# Patient Record
Sex: Female | Born: 1995 | Race: White | Hispanic: No | Smoking: Never smoker
Health system: Southern US, Community
[De-identification: ages and names within clinical notes are randomized; demographics above are authoritative.]

## PROBLEM LIST (undated history)

## (undated) ENCOUNTER — Ambulatory Visit: Admission: EM | Payer: Medicaid Other | Source: Home / Self Care

## (undated) DIAGNOSIS — Z789 Other specified health status: Secondary | ICD-10-CM

## (undated) DIAGNOSIS — M1990 Unspecified osteoarthritis, unspecified site: Secondary | ICD-10-CM

## (undated) DIAGNOSIS — F329 Major depressive disorder, single episode, unspecified: Secondary | ICD-10-CM

## (undated) DIAGNOSIS — F32A Depression, unspecified: Secondary | ICD-10-CM

## (undated) DIAGNOSIS — F419 Anxiety disorder, unspecified: Secondary | ICD-10-CM

## (undated) HISTORY — PX: TYMPANOSTOMY TUBE PLACEMENT: SHX32

## (undated) HISTORY — DX: Unspecified osteoarthritis, unspecified site: M19.90

---
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
## 2015-03-24 ENCOUNTER — Encounter (HOSPITAL_COMMUNITY): Payer: Self-pay | Admitting: Emergency Medicine

## 2015-03-24 ENCOUNTER — Emergency Department (HOSPITAL_COMMUNITY)
Admission: EM | Admit: 2015-03-24 | Discharge: 2015-03-24 | Disposition: A | Discharge status: Other Acute Inpatient Hospital | Payer: BLUE CROSS/BLUE SHIELD | Attending: Emergency Medicine | Admitting: Emergency Medicine

## 2015-03-24 ENCOUNTER — Inpatient Hospital Stay (HOSPITAL_COMMUNITY)
Admission: AD | Admit: 2015-03-24 | Discharge: 2015-03-28 | DRG: 885 | Disposition: A | Discharge status: Home / Self Care | Payer: BLUE CROSS/BLUE SHIELD | Source: Intra-hospital | Attending: Psychiatry | Admitting: Psychiatry

## 2015-03-24 DIAGNOSIS — R45851 Suicidal ideations: Secondary | ICD-10-CM | POA: Insufficient documentation

## 2015-03-24 DIAGNOSIS — F332 Major depressive disorder, recurrent severe without psychotic features: Secondary | ICD-10-CM | POA: Diagnosis present

## 2015-03-24 DIAGNOSIS — Z3202 Encounter for pregnancy test, result negative: Secondary | ICD-10-CM | POA: Diagnosis not present

## 2015-03-24 HISTORY — DX: Other specified health status: Z78.9

## 2015-03-24 LAB — CBC
HCT: 39.8 % (ref 36.0–46.0)
Hemoglobin: 13.5 g/dL (ref 12.0–15.0)
MCH: 28.3 pg (ref 26.0–34.0)
MCHC: 33.9 g/dL (ref 30.0–36.0)
MCV: 83.4 fL (ref 78.0–100.0)
Platelets: 346 10*3/uL (ref 150–400)
RBC: 4.77 MIL/uL (ref 3.87–5.11)
RDW: 12.5 % (ref 11.5–15.5)
WBC: 8.7 10*3/uL (ref 4.0–10.5)

## 2015-03-24 LAB — COMPREHENSIVE METABOLIC PANEL
ALT: 15 U/L (ref 0–35)
AST: 18 U/L (ref 0–37)
Albumin: 4.3 g/dL (ref 3.5–5.2)
Alkaline Phosphatase: 44 U/L (ref 39–117)
Anion gap: 10 (ref 5–15)
BUN: 12 mg/dL (ref 6–23)
CO2: 22 mmol/L (ref 19–32)
Calcium: 9.6 mg/dL (ref 8.4–10.5)
Chloride: 105 mmol/L (ref 96–112)
Creatinine, Ser: 0.7 mg/dL (ref 0.50–1.10)
GFR calc Af Amer: >90 mL/min (ref >90)
GFR calc non Af Amer: >90 mL/min (ref >90)
Glucose, Bld: 100 mg/dL — ABNORMAL HIGH (ref 70–99)
Potassium: 3.7 mmol/L (ref 3.5–5.1)
Sodium: 137 mmol/L (ref 135–145)
Total Bilirubin: 0.6 mg/dL (ref 0.3–1.2)
Total Protein: 7.4 g/dL (ref 6.0–8.3)

## 2015-03-24 LAB — ACETAMINOPHEN LEVEL: Acetaminophen (Tylenol), Serum: <10 ug/mL — ABNORMAL LOW (ref 10–30)

## 2015-03-24 LAB — ETHANOL: Alcohol, Ethyl (B): <5 mg/dL (ref 0–9)

## 2015-03-24 LAB — RAPID URINE DRUG SCREEN, HOSP PERFORMED
Amphetamines: NOT DETECTED
Barbiturates: NOT DETECTED
Benzodiazepines: NOT DETECTED
Cocaine: NOT DETECTED
Opiates: NOT DETECTED
Tetrahydrocannabinol: NOT DETECTED

## 2015-03-24 LAB — SALICYLATE LEVEL: Salicylate Lvl: <4 mg/dL (ref 2.8–20.0)

## 2015-03-24 LAB — PREGNANCY, URINE: Preg Test, Ur: NEGATIVE

## 2015-03-24 MED ORDER — LORAZEPAM 1 MG PO TABS: 1.0000 mg | ORAL_TABLET | Freq: Three times a day (TID) | ORAL | Status: DC | PRN

## 2015-03-24 NOTE — ED Notes (Signed)
MD at bedside. 

## 2015-03-24 NOTE — ED Notes (Signed)
Pt. Noted sitting in room. No complaints or concerns voiced. No distress or abnormal behavior noted. Will continue to monitor with security cameras. Q 15 minute rounds continue. 

## 2015-03-24 NOTE — ED Notes (Addendum)
Pt's belongings are going home with her parents. Pt wanded by security.

## 2015-03-24 NOTE — BH Assessment (Addendum)
Tele Assessment Note   [name] is an 19 y.o. female, single, Caucasian who presents to [facility] ED accompanied by her parents, who did not participate in assessment. Pt reports she has experienced symptoms of depression since 8th grade but over the past four months her symptoms have more acute and frequent. She states today she was so depressed she got her parent's shotgun with the intention of shooting herself. She says she realized she needed help and came to the ED. Pt reports she intended to kill herself one other time and was going to jump from a parking deck but a friend intervened. Pt reports symptoms including daily crying spells, decreased sleep, social withdrawal, anhedonia, irritability and feelings of hopelessness. She reports poor appetite and losing 25 pounds since she started college. She scaled her current depression at 9/10. She says she has a history of superficial cutting or her arms, thighs and abdomen. She says she has experienced some mild anxiety and denies any history of panic. She denies homicidal ideation or history of violence. She denies any history of psychotic symptoms. She denies alcohol or substance use.  Pt cannot identify any precipitant for her suicidal gesture today. She also cannot identify any specific stressors. She states she finds college no more stressful than high school. She denies any relationship, academic, legal, medical or financial problems. She denies any history of abuse or trauma. She lives on-campus at [facility] when she is not staying with her parents. She describes her parents as supportive but does not feel they realize the severity of her depression. She has no siblings. There is no known history of mental health or substance abuse problems. Pt states she has a friend at college who knows everything about her depression and another friend from high school who has experienced depression herself and is also supportive.   Pt states she started  seeing a counselor through [facility] three weeks ago and has an appointment with a physician for medication evaluation 03/29/15. She says she has never taken psychiatric medication before. She has no other history of outpatient or inpatient mental health or substance abuse treatment.  Pt is dressed in hospital scrubs, alert, oriented x4 with normal speech and normal motor behavior. Eye contact is good. Pt's mood is depressed and affect is congruent with mood. Thought process is coherent and relevant. There is no indication Pt is currently responding to internal stimuli or experiencing delusional thought content. Pt was calm and cooperative throughout assessment. She says she expected to be admitted to an inpatient psychiatric facility and wants hep for her depression.   Axis I: Major Depressive Disorder, Recurrent, Severe Without Psychotic Features. Axis II: Deferred Axis III: No past medical history on file. Axis IV: other psychosocial or environmental problems Axis V: GAF=30  Past Medical History: No past medical history on file.  Past Surgical History  Procedure Laterality Date  . Tympanostomy tube placement      Family History: No family history on file.  Social History:  reports that she has never smoked. She does not have any smokeless tobacco history on file. She reports that she does not drink alcohol or use illicit drugs.  Additional Social History:  Alcohol / Drug Use Pain Medications: None Prescriptions: None Over the Counter: None History of alcohol / drug use?: No history of alcohol / drug abuse Longest period of sobriety (when/how long): NA  CIWA: CIWA-Ar BP: 152/74 mmHg Pulse Rate: 84 COWS:    PATIENT  STRENGTHS: (choose at least two) Ability for insight Average or above average intelligence Capable of independent living Communication skills Financial means General fund of knowledge Motivation for treatment/growth Physical Health Supportive  family/friends  Allergies: No Known Allergies  Home Medications:  (Not in a hospital admission)  OB/GYN Status:  Patient's last menstrual period was 02/28/2015.  General Assessment Data Location of Assessment: [facility] ED Is this a Tele or Face-to-Face Assessment?: Face-to-Face Is this an Initial Assessment or a Re-assessment for this encounter?: Initial Assessment Living Arrangements: Parent, Other (Comment) (Lives on campus at [facility] when not with parents) Can pt return to current living arrangement?: Yes Admission Status: Voluntary Is patient capable of signing voluntary admission?: Yes Transfer from: Home Referral Source: Self/Family/Friend     [facility] Crisis Care Plan Living Arrangements: Parent, Other (Comment) (Lives on campus at [facility] when not with parents) Name of Psychiatrist: None Name of Therapist: Zenda Alpers at [facility]  Education Status Is patient currently in school?: Yes Current Grade: Freshman Highest grade of school patient has completed: High School Name of school: [employer]  Contact person: NA  Risk to self with the past 6 months Suicidal Ideation: Yes-Currently Present Suicidal Intent: Yes-Currently Present Is patient at risk for suicide?: Yes Suicidal Plan?: Yes-Currently Present Specify Current Suicidal Plan: Had shotgun earlier today and was going to shoot herself Access to Means: Yes Specify Access to Suicidal Means: Parent's shotgun What has been your use of drugs/alcohol within the last 12 months?: Pt denies Previous Attempts/Gestures: Yes How many times?: 1 (February 17, 2015 was going to jump off a parking deck) Other Self Harm Risks: Pt reports history of superficial cutting Triggers for Past Attempts: None known Intentional Self Injurious Behavior: Cutting Comment - Self Injurious Behavior: Pt reports superficial cutting on arms, thighs, abdomen Family Suicide History: No Recent stressful life event(s): Other (Comment) (Pt cannot identify any  specific stressors) Persecutory voices/beliefs?: No Depression: Yes Depression Symptoms: Despondent, Insomnia, Tearfulness, Isolating, Fatigue, Guilt, Loss of interest in usual pleasures, Feeling angry/irritable, Feeling worthless/self pity Substance abuse history and/or treatment for substance abuse?: No Suicide prevention information given to non-admitted patients: Not applicable  Risk to Others within the past 6 months Homicidal Ideation: No Thoughts of Harm to Others: No Current Homicidal Intent: No Current Homicidal Plan: No Access to Homicidal Means: No Identified Victim: None History of harm to others?: No Assessment of Violence: None Noted Violent Behavior Description: Pt denies history of violence Does patient have access to weapons?: Yes (Comment) (Pt has access to shotgun) Criminal Charges Pending?: No Does patient have a court date: No  Psychosis Hallucinations: None noted Delusions: None noted  Mental Status Report Appearance/Hygiene: In scrubs Eye Contact: Good Motor Activity: Unremarkable Speech: Logical/coherent Level of Consciousness: Alert Mood: Depressed Affect: Depressed Anxiety Level: None Thought Processes: Coherent, Relevant Judgement: Unimpaired Orientation: Person, Place, Time, Situation, Appropriate for developmental age Obsessive Compulsive Thoughts/Behaviors: None  Cognitive Functioning Concentration: Normal Memory: Recent Intact, Remote Intact IQ: Average Insight: Fair Impulse Control: Fair Appetite: Poor Weight Loss: 25 Weight Gain: 0 Sleep: Decreased Total Hours of Sleep: 5 Vegetative Symptoms: None  ADLScreening [facility]) Patient's cognitive ability adequate to safely complete daily activities?: Yes Patient able to express need for assistance with ADLs?: Yes Independently performs ADLs?: Yes (appropriate for developmental age)  Prior Inpatient Therapy Prior Inpatient Therapy: No Prior Therapy Dates: NA Prior  Therapy Facilty/Provider(s): NA Reason for Treatment: NA  Prior Outpatient Therapy Prior Outpatient Therapy: Yes Prior Therapy Dates: Current  Prior Therapy Facilty/Provider(s): [facility] Reason for Treatment: Depression  ADL Screening (condition at time of admission) Patient's cognitive ability adequate to safely complete daily activities?: Yes Is the patient deaf or have difficulty hearing?: No Does the patient have difficulty seeing, even when wearing glasses/contacts?: No Does the patient have difficulty concentrating, remembering, or making decisions?: No Patient able to express need for assistance with ADLs?: Yes Does the patient have difficulty dressing or bathing?: No Independently performs ADLs?: Yes (appropriate for developmental age) Does the patient have difficulty walking or climbing stairs?: No Weakness of Legs: None Weakness of Arms/Hands: None  Home Assistive Devices/Equipment Home Assistive Devices/Equipment: None    Abuse/Neglect Assessment (Assessment to be complete while patient is alone) Physical Abuse: Denies Verbal Abuse: Denies Sexual Abuse: Denies Exploitation of patient/patient's resources: Denies Self-Neglect: Denies     Merchant navy officerAdvance Directives (For Healthcare) Does patient have an advance directive?: No Would patient like information on creating an advanced directive?: No - patient declined information    Additional Information 1:1 In Past 12 Months?: No CIRT Risk: No Elopement Risk: No Does patient have medical clearance?: Yes     Disposition: Binnie RailJoann Glover[facility] confirmed bed [facility]. Gave clinical report to Hulan FessIjeoma Nwaeze, NP who agreed Pt meets criteria for inpatient psychiatric treatment and accepted Pt to the service of Dr. Carmon GinsbergF. Cobos, room 402-2. Notified Dr. Ethelda ChickJacubowitz and [facility] of acceptance. Pt signed voluntary consent for treatment.   Disposition Disposition of Patient: Inpatient treatment  program Type of inpatient treatment program: Adult   Pamalee LeydenFord Ellis Warrick Jr, [facility], [facility] Triage Specialist [phone]   Pamalee LeydenWarrick Jr, Ford Ellis 03/24/2015 9:28 PM

## 2015-03-24 NOTE — BH Assessment (Signed)
Spoke to Dr. Doug SouSam Jacubowitz who said Pt reports depressive symptoms and states she is suicidal and had a shotgun earlier today. Assessment will be initiated.  .fw

## 2015-03-24 NOTE — ED Notes (Signed)
Pt. To [facility] from ED ambulatory without difficulty, to room 37. Pt. Is alert and oriented, warm and dry in no distress. Pt. Denies SI, HI, and AVH. Pt. Calm and cooperative. Pt. Made aware of security cameras and Q15 minute rounds. Pt. Encouraged to let Nursing staff know of any concerns or needs.  

## 2015-03-24 NOTE — ED Notes (Signed)
[facility] Expected date:  Expected time:  Means of arrival:  [facility]

## 2015-03-24 NOTE — ED Provider Notes (Signed)
CSN: [id number]     Arrival date & time 03/24/15  2014 History   First MD Initiated Contact with Patient 03/24/15 2024     Chief Complaint  Patient presents with  . Suicidal     (Consider location/radiation/quality/duration/timing/severity/associated sxs/prior Treatment) HPI Patient reports feeling suicidal. She reports that tonight she had a shotgun and thought about killing herself. She does not feel it safe at home. She is not depressed over any specific reason however has suffered depression since the eighth grade. No treatment prior to coming here. No other associated symptoms. No past medical history on file. Past Surgical History  Procedure Laterality Date  . Tympanostomy tube placement     No family history on file. History  Substance Use Topics  . Smoking status: Never Smoker   . Smokeless tobacco: Not on file  . Alcohol Use: No   OB History    No data available     Review of Systems  Constitutional: Negative.   HENT: Negative.   Respiratory: Negative.   Cardiovascular: Negative.   Gastrointestinal: Negative.   Musculoskeletal: Negative.   Skin: Negative.   Neurological: Negative.   Psychiatric/Behavioral: Positive for suicidal ideas.  All other systems reviewed and are negative.     Allergies  Review of patient's allergies indicates no known allergies.  Home Medications   Prior to Admission medications   Not on File   BP 152/74 mmHg  Pulse 84  Temp(Src) 98.9 F (37.2 C) (Oral)  SpO2 100%  LMP 02/28/2015 Physical Exam  Constitutional: She is oriented to person, place, and time. She appears well-developed and well-nourished.  HENT:  Head: Normocephalic and atraumatic.  Eyes: Conjunctivae are normal. Pupils are equal, round, and reactive to light.  Neck: Neck supple. No tracheal deviation present. No thyromegaly present.  Cardiovascular: Normal rate and regular rhythm.   No murmur heard. Pulmonary/Chest: Effort normal and breath sounds normal.   Abdominal: Soft. Bowel sounds are normal. She exhibits no distension. There is no tenderness.  Musculoskeletal: Normal range of motion. She exhibits no edema or tenderness.  Neurological: She is alert and oriented to person, place, and time. No cranial nerve deficit. Coordination normal.  Gait normal  Skin: Skin is warm and dry. No rash noted.  Psychiatric: She has a normal mood and affect.  Nursing note and vitals reviewed.   ED Course  Procedures (including critical care time) Labs Review Labs Reviewed - No data to display  Imaging Review No results found.   EKG Interpretation None     10 PM patient resting comfortably alert Glasgow Coma Score 15, sitting in bed eating a sandwich Results for orders placed or performed during the hospital encounter of 03/24/15  Acetaminophen level  Result Value Ref Range   Acetaminophen (Tylenol), Serum <10.0 (L) 10 - 30 ug/mL  CBC  Result Value Ref Range   WBC 8.7 4.0 - 10.5 K/uL   RBC 4.77 3.87 - 5.11 MIL/uL   Hemoglobin 13.5 12.0 - 15.0 g/dL   HCT 40.939.8 81.136.0 - 91.446.0 %   MCV 83.4 78.0 - 100.0 fL   MCH 28.3 26.0 - 34.0 pg   MCHC 33.9 30.0 - 36.0 g/dL   RDW 78.212.5 95.611.5 - 21.315.5 %   Platelets 346 150 - 400 K/uL  Comprehensive metabolic panel  Result Value Ref Range   Sodium 137 135 - 145 mmol/L   Potassium 3.7 3.5 - 5.1 mmol/L   Chloride 105 96 - 112 mmol/L   CO2 22 19 -  32 mmol/L   Glucose, Bld 100 (H) 70 - 99 mg/dL   BUN 12 6 - 23 mg/dL   Creatinine, Ser 9.14 0.50 - 1.10 mg/dL   Calcium 9.6 8.4 - 78.2 mg/dL   Total Protein 7.4 6.0 - 8.3 g/dL   Albumin 4.3 3.5 - 5.2 g/dL   AST 18 0 - 37 U/L   ALT 15 0 - 35 U/L   Alkaline Phosphatase 44 39 - 117 U/L   Total Bilirubin 0.6 0.3 - 1.2 mg/dL   GFR calc non Af Amer >90 >90 mL/min   GFR calc Af Amer >90 >90 mL/min   Anion gap 10 5 - 15  Ethanol (ETOH)  Result Value Ref Range   Alcohol, Ethyl (B) <5 0 - 9 mg/dL  Salicylate level  Result Value Ref Range   Salicylate Lvl <4.0 2.8 - 20.0  mg/dL   No results found.  MDM  I feel the patient warrants inpatient stay for psychiatric evaluation as she is a serious suicide risk and has access to a gun Final diagnoses:  None   Inpatient bed has been arranged for patient at [facility] Diagnosis suicidal ideation    Doug Sou, MD 03/24/15 2201

## 2015-03-24 NOTE — ED Notes (Signed)
Pt states that she has been feeling depressed for 2-3 months and [facility] but states that she doesn't feel like its helping. States she has been having thoughts of shooting herself. Alert and oriented. Accompanied by parents but she asked them to step to waiting room.

## 2015-03-25 ENCOUNTER — Encounter (HOSPITAL_COMMUNITY): Payer: Self-pay

## 2015-03-25 DIAGNOSIS — F332 Major depressive disorder, recurrent severe without psychotic features: Principal | ICD-10-CM

## 2015-03-25 DIAGNOSIS — R45851 Suicidal ideations: Secondary | ICD-10-CM

## 2015-03-25 LAB — TSH: TSH: 4.1 u[IU]/mL (ref 0.350–4.500)

## 2015-03-25 MED ORDER — MAGNESIUM HYDROXIDE 400 MG/5ML PO SUSP: 30.0000 mL | Freq: Every day | ORAL | Status: DC | PRN

## 2015-03-25 MED ORDER — CITALOPRAM HYDROBROMIDE 10 MG PO TABS
10.0000 mg | ORAL_TABLET | Freq: Every day | ORAL | Status: DC
  Administered 2015-03-25 – 2015-03-28 (×4): 10 mg via ORAL
  Filled 2015-03-25 (×2): qty 1
  Filled 2015-03-25: qty 7
  Filled 2015-03-25 (×3): qty 1

## 2015-03-25 MED ORDER — ACETAMINOPHEN 325 MG PO TABS: 650.0000 mg | ORAL_TABLET | Freq: Four times a day (QID) | ORAL | Status: DC | PRN

## 2015-03-25 MED ORDER — ALUM & MAG HYDROXIDE-SIMETH 200-200-20 MG/5ML PO SUSP: 30.0000 mL | ORAL | Status: DC | PRN

## 2015-03-25 MED ORDER — TRAZODONE HCL 50 MG PO TABS
50.0000 mg | ORAL_TABLET | Freq: Every evening | ORAL | Status: DC | PRN
  Filled 2015-03-25: qty 7

## 2015-03-25 MED ORDER — NAPROXEN 500 MG PO TABS
500.0000 mg | ORAL_TABLET | Freq: Two times a day (BID) | ORAL | Status: DC
  Administered 2015-03-25 – 2015-03-26 (×2): 500 mg via ORAL
  Filled 2015-03-25 (×11): qty 1

## 2015-03-25 NOTE — Progress Notes (Signed)
D:Patient in the dayroom on approach.  When patient was asked how her day was she stated, "fine."  Patient states she has depression and has had it for sometime but cannot describe it.  Patient is unable to identify any stressors and has a hard time verbalizing how she feels at this time.  Patient states, "it's hard to explain."  Patient denies SI/HI and denies AVH.  Patient verbally contracts for safety.   A: Staff to monitor Q 15 mins for safety.  Encouragement and support offered.  Scheduled medications administered per orders. R: Patient remains safe on the unit.  Patient attended group tonight.  Patient visible on the unit.  Patient taking administered medications.

## 2015-03-25 NOTE — Progress Notes (Signed)
Patient admitted voluntarily for suicidal ideations that she reports having for the past 3 or 4 months. She reports that her depression has worsened and she needed to come in for help. She has had thoughts to shoot self. She has no medical history. 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 She currently denies si/hi/a/v hallucinations. She wants to be started on depression medication. She was oriented to unit and safety is maintained on unit with 15 min checks.

## 2015-03-25 NOTE — Tx Team (Signed)
Initial Interdisciplinary Treatment Plan   PATIENT STRESSORS: Educational concerns   PATIENT STRENGTHS: Ability for insight Motivation for treatment/growth   PROBLEM LIST: Problem List/Patient Goals Date to be addressed Date deferred Reason deferred Estimated date of resolution  Depression      SI                  Patients goal- Patient reports not to feel as bad as she has lately and wants to be back to point where she does not feeling suicidal.                               DISCHARGE CRITERIA:  Improved stabilization in mood, thinking, and/or behavior  PRELIMINARY DISCHARGE PLAN: Return to previous living arrangement  PATIENT/FAMIILY INVOLVEMENT: This treatment plan has been presented to and reviewed with the patient, [name], and/or family member.  The patient and family have been given the opportunity to ask questions and make suggestions.  Floyce StakesGarrison, Jacqueline 03/25/2015, 2:08 AM
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
## 2015-03-25 NOTE — Progress Notes (Signed)
D: Patient denies SI/HI and A/V hallucinations; patient reports sleep is poor; reports appetite is poor; reports energy level is normal; rates depression as 3/10; rates hopelessness 2/10; rates anxiety as 4/10;   A: Monitored q 15 minutes; patient encouraged to attend groups; patient educated about medications; patient given medications per physician orders; patient encouraged to express feelings and/or concerns  R: Patient is flat and depressed; patient is blunted; patient is cooperative; patient is guarded and mostly gives one worded answers; patient's interaction with staff and peers is minimal; patient was able to set goal to talk with staff 1:1 when having feelings of SI; patient is taking medications as prescribed and tolerating medications; patient is attending all groups

## 2015-03-25 NOTE — Progress Notes (Signed)
Recreation Therapy Notes  Date: 03.28.2016 Time: 9:30am Location: [address] Group Room   Group Topic: Stress Management  Goal Area(s) Addresses:  Patient will actively participate in stress management techniques presented during session.   Behavioral Response: Did not attend.   Marykay Lexenise L Blanchfield, LRT/CTRS  Blanchfield, Denise L 03/25/2015 10:17 AM

## 2015-03-25 NOTE — Progress Notes (Signed)
[facility] Group Notes:  (Nursing/MHT/Case Management/Adjunct)  Date:  03/25/2015  Time:  11:56 PM  Type of Therapy:  Group Therapy  Participation Level:  Minimal  Participation Quality:  Appropriate  Affect:  Appropriate  Cognitive:  Appropriate  Insight:  Appropriate  Engagement in Group:  Engaged  Modes of Intervention:  Socialization and Support  Summary of Progress/Problems: Pt. Was engaged and had good insight in group discussion.  Pt. Stated that she is more social and laughs more when she is feeling well.  Sondra ComeWilson, Ryan J 03/25/2015, 11:56 PM

## 2015-03-25 NOTE — B[facility] Counselor (Signed)
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 Employment / Job issues: N/A Family Relationships: Reports that parents "freak out" about her depression which is stressful Surveyor, quantityinancial / Lack of resources (include bankruptcy): Financial stressors but receives assistance from parents Housing / Lack of housing: Lives on campus since August 2015, has 1 roommate gets along well with Physical health (include injuries & life threatening diseases): N/A Social relationships:  Reports one stressful friendship Substance abuse: N/A Bereavement / Loss: Denies  Living/Environment/Situation:  Living Arrangements: Lives on campus with a suitemate Living conditions (as described by patient or guardian): Lives on campus with a suitemate How long has patient lived in current situation?: Since August 2015 What is atmosphere in current home: Comfortable, Supportive  Family History:  Marital status: Single  Additional relationship information: N/A Does patient have children?: No  Childhood History:  By whom was/is the patient raised?: Both parents Description of patient's relationship with caregiver when they were a child: "got kind of bad when I was in middle school- my parents almost got divorced so they fought a lot". Felt like she was the "Museum/gallery curatorpeace keeper" Patient's description of current relationship with people who raised him/her: Worries about upsetting parents so does not share a lot with them about her depression  Does patient have siblings?: No Did patient suffer any verbal/emotional/physical/sexual abuse as a child?: No Did patient suffer from severe childhood neglect?: No Has patient ever been sexually abused/assaulted/raped as an adolescent or adult?: No Was the patient ever a victim of a crime or a disaster?: No Witnessed domestic violence?: No Has  patient been effected by domestic violence as an adult?: No   Education:  Highest grade of school patient has completed: Printmakerreshman in college Currently a student?: Yes- [place] Learning disability?: No   Employment/Work Situation:  Employment situation: Consulting civil engineertudent What is the longest time patient has a held a job?: 6 months Where was the patient employed at that time?: [employer]  Has patient ever been in the Eli Lilly and Companymilitary?: No Has patient ever served in Buyer, retailcombat?: No  Financial Resources:  Surveyor, quantityinancial resources: Therapist, sportstudent loans and family support Does patient have a Lawyerrepresentative payee or guardian?: No  Alcohol/Substance Abuse:  What has been your use of drugs/alcohol within the last 12 months?: Denies If attempted suicide, did drugs/alcohol play a role in this?: No Alcohol/Substance Abuse Treatment Hx: Denies past history Has alcohol/substance abuse ever caused legal problems?: No  Social Support System:  Conservation officer, natureatient's Community Support System: Strong Describe Community Support System: Family, friends, counselor on campus Type of faith/religion: Patient does not identify as religious How does patient's faith help to cope with current illness?: N/A  Leisure/Recreation:  Leisure and Hobbies: soccer, drawing   Strengths/Needs:  What things does the patient do well?: drawing, athletic In what areas does patient struggle / problems for patient: depression and isolation, loss of appetite, losing weigh quickly  Discharge Plan:  Does patient have access to transportation?: Yes Will patient be returning to same living situation after discharge?: Yes- plans to return home Currently receiving community mental health services: Yes (From Whom) ([facility] CatholicDara O'Donnell) If no, would patient like referral for services when discharged?: No Does patient have financial barriers related to discharge medications?: No Patient description of barriers related to  discharge medications: N/A  Summary/Recommendations:  Patient is a 19 year old Caucasian female admitted for SI and increasing depression.  Patient is a Printmaker at [employer] She reports having a strong support system and follows up with [facility] and reports that she will be seeing a psychiatrist on Friday. Patient will benefit from crisis stabilization, medication evaluation, group therapy, and psycho education in addition to case management for discharge planning. Patient and CSW reviewed pt's identified goals and treatment plan. Pt verbalized understanding and agreed to treatment plan.  Samuella Bruin, MSW, [employer] Clinical Social Worker [facility] [phone]

## 2015-03-26 LAB — HEMOGLOBIN A1C
Hgb A1c MFr Bld: 5.3 % (ref 4.8–5.6)
Mean Plasma Glucose: 105 mg/dL

## 2015-03-26 NOTE — Progress Notes (Signed)
[facility] Group Notes:  (Nursing/MHT/Case Management/Adjunct)  Date:  03/26/2015  Time:  11:54 PM  Type of Therapy:  Group Therapy  Participation Level:  Active  Participation Quality:  Appropriate  Affect:  Appropriate  Cognitive:  Appropriate  Insight:  Appropriate  Engagement in Group:  Engaged  Modes of Intervention:  Socialization and Support  Summary of Progress/Problems: Pt. Was engaged in group discussion of recovery.  Pt. Stated her recovery is when she is more social, feels happy, and laughs.  Sondra ComeWilson, Ryan J 03/26/2015, 11:54 PM

## 2015-03-26 NOTE — Progress Notes (Signed)
Pt attended spiritual care group on grief and loss facilitated by chaplain Burnis KingfisherMatthew Stalnaker and counseling intern [place]. Group opened with brief discussion and psycho-social ed around grief and loss in relationships and in relation to self - identifying life patterns, circumstances, changes that cause losses. Established group norm of speaking from own life experience. Group goal of establishing open and affirming space for members to share loss and experience with grief, normalize grief experience and provide psycho social education and grief support.  Group drew on narrative and Alderian therapeutic modalities.   [name] was present throughout group but did not contribute to group discussion. Did appear to be attentive as evidence by nodding and eye contact with other group members.  [place] Austin Counseling Intern

## 2015-03-26 NOTE — Tx Team (Signed)
Interdisciplinary Treatment Plan Update   Date Reviewed:  03/26/2015  Time Reviewed:  8:47 AM  Progress in Treatment:   Attending groups: Yes, patient is attending groups. Participating in groups: Yes, engages in group discussion. Taking medication as prescribed: Yes  Tolerating medication: Yes Family/Significant other contact made:  No, but will ask patient for consent for collateral contact Patient understands diagnosis: Yes, patient understands diagnosis and need for treatment. Discussing patient identified problems/goals with staff: Yes, patient is able to express goals for treatment and discharge. Medical problems stabilized or resolved: Yes Denies suicidal/homicidal ideation: Yes Patient has not harmed self or others: Yes  For review of initial/current patient goals, please see plan of care.  Estimated Length of Stay:  2-4 days  Reasons for Continued Hospitalization:  Anxiety Depression Medication stabilization   New Problems/Goals identified:    Discharge Plan or Barriers:   Home with outpatient follow up to be determined  Additional Comments:  Continue medication stabilization  Patient and CSW reviewed patient's identified goals and treatment plan.  Patient verbalized understanding and agreed to treatment plan.   Attendees:  Patient:  03/26/2015 8:47 AM   Signature:  Sallyanne HaversF. Cobos, MD 03/26/2015 8:47 AM  Signature: Geoffery LyonsIrving Lugo, MD 03/26/2015 8:47 AM  Signature:  Earl ManySara Twyman, RN 03/26/2015 8:47 AM  Signature:  Liborio NixonPatrice White, RN 03/26/2015 8:47 AM  Signature:   03/26/2015 8:47 AM  Signature:  Juline PatchQuylle Hodnett, LCSW 03/26/2015 8:47 AM  Signature:  Belenda CruiseKristin Drinkard, LCSW-A 03/26/2015 8:47 AM  Signature:  Leisa LenzValerie Enoch, Care Coordinator [facility] 03/26/2015 8:47 AM  Signature:   03/26/2015 8:47 AM  Signature:  03/26/2015  8:47 AM  Signature:   Onnie BoerJennifer Clark, RN [facility] 03/26/2015  8:47 AM  Signature:   03/26/2015  8:47 AM    Scribe for Treatment Team:   Juline PatchQuylle Hodnett,  03/26/2015 8:47  AM

## 2015-03-26 NOTE — [facility] Suicide Risk Assessment (Signed)
[facility] INPATIENT:  Family/Significant Other Suicide Prevention Education  Suicide Prevention Education:  Education Completed; Michele JohnSonia Baird -  Mother, [phone]; has been identified by the patient as the family member/significant other with whom the patient will be residing, and identified as the person(s) who will aid the patient in the event of a mental health crisis (suicidal ideations/suicide attempt).  With written consent from the patient, the family member/significant other has been provided the following suicide prevention education, prior to the and/or following the discharge of the patient.  The suicide prevention education provided includes the following:  Suicide risk factors  Suicide prevention and interventions  National Suicide Hotline telephone number  [facility] assessment telephone number  [facility] Emergency Assistance 911  [facility] and/or [facility]idential [facility] Crisis [facility] telephone number  Request made of family/significant other to:  Remove weapons (e.g., guns, rifles, knives), all items previously/currently identified as safety concern.   Mother advised patient does not have access to weapons.     Remove drugs/medications (over-the-counter, prescriptions, illicit drugs), all items previously/currently identified as a safety concern.  The family member/significant other verbalizes understanding of the suicide prevention education information provided.  The family member/significant other agrees to remove the items of safety concern listed above.  [name], [name] 03/26/2015, 12:39 PM

## 2015-03-26 NOTE — Plan of Care (Signed)
Problem: Diagnosis: Increased Risk For Suicide Attempt Goal: LTG-Patient Will Show Positive Response to Medication LTG (by discharge) : Patient will show positive response to medication and will participate in the development of the discharge plan.  Outcome: Not Progressing Patient still rates depression high and voices she cannot explain the way she is feeling currently.

## 2015-03-26 NOTE — Clinical Social Work Note (Signed)
CSW receive [facility] advising they are crisis intervention and recommending [facility].  Mr. [name] [phone] advised they refer patient's out when there has been a hospitalization.

## 2015-03-26 NOTE — Progress Notes (Signed)
[facility] MD Progress Note  03/26/2015 4:46 PM [name]  MRN:  [id number] Subjective:   States she is feeling " a little better". She is feeling less  Severely depressed. Denies any suicidal ideations at this time and states " I have not been thinking about it ".  Objective : I have discussed case with treatment team and have met with patient. Has been visible in milieu, going to groups, although staff reports that she has been somewhat withdrawn, with limited participation. States she has started to socialize with peers. Family came to visit last night, states  Visit went well. Thus far denies any medication side effects from Celexa. Describes ongoing neuro-vegetative symptoms such as poor appetite, fair energy level, some anhedonia.  TSH WNL,  HgbA1C WNL.   Principal Problem: Major depressive disorder, recurrent severe without psychotic features Diagnosis:   Patient Active Problem List   Diagnosis Date Noted  . Major depressive disorder, recurrent severe without psychotic features [F33.2] 03/25/2015   Total Time spent with patient: 25 minutes    Past Medical History:  Past Medical History  Diagnosis Date  . Medical history non-contributory     Past Surgical History  Procedure Laterality Date  . Tympanostomy tube placement     Family History: History reviewed. No pertinent family history. Social History:  History  Alcohol Use No     History  Drug Use No    History   Social History  . Marital Status: Single    Spouse Name: N/A  . Number of Children: N/A  . Years of Education: N/A   Social History Main Topics  . Smoking status: Never Smoker   . Smokeless tobacco: Not on file  . Alcohol Use: No  . Drug Use: No  . Sexual Activity: No   Other Topics Concern  . None   Social History Narrative   Additional History:    Sleep: Fair  Appetite:  Fair   Assessment:   Musculoskeletal: Strength & Muscle Tone: within normal limits Gait & Station:  normal Patient leans: N/A   Psychiatric Specialty Exam: Physical Exam  Review of Systems  Constitutional: Negative for fever and chills.  Respiratory: Negative for cough and shortness of breath.   Cardiovascular: Negative for chest pain.  Gastrointestinal: Positive for nausea. Negative for vomiting, abdominal pain and diarrhea.  Skin: Negative for rash.  Neurological: Positive for headaches.  Psychiatric/Behavioral: Positive for depression.    Blood pressure 124/49, pulse 91, temperature 98.2 F (36.8 C), temperature source Oral, resp. rate 16, height _0  (1.6 m), weight 139 lb (63.05 kg), last menstrual period 02/28/2015.Body mass index is 24.63 kg/(m^2).  General Appearance: Fairly Groomed  Engineer, water::  Good  Speech:  Normal Rate  Volume:  Normal  Mood:  states she is feeling better, less depressed   Affect:  Constricted  Thought Process:  Goal Directed and Linear  Orientation:  Full (Time, Place, and Person)  Thought Content:  Denies hallucinations, no delusions, not internally preoccupied   Suicidal Thoughts:  No At this time denies any suicidal plan or intent and contracts for safety on the unit  Homicidal Thoughts:  No  Memory:  recent and remote grossly intact   Judgement:  Fair  Insight:  Fair  Psychomotor Activity:  Normal  Concentration:  Good  Recall:  Good  Fund of Knowledge:Good  Language: Good  Akathisia:  Negative  Handed:  Right  AIMS (if indicated):     Assets:  Desire for Improvement Resilience  ADL's:  Improving   Cognition: WNL  Sleep:  Number of Hours: 5.75     Current Medications: Current Facility-Administered Medications  Medication Dose Route Frequency Provider Last Rate Last Dose  . acetaminophen (TYLENOL) tablet 650 mg  650 mg Oral Q6H PRN [name], NP      . alum & mag hydroxide-simeth (MAALOX/MYLANTA) 200-200-20 MG/5ML suspension 30 mL  30 mL Oral Q4H PRN [name], NP      . citalopram (CELEXA) tablet 10 mg  10 mg Oral  Daily [name], MD   10 mg at 03/26/15 0836  . magnesium hydroxide (MILK OF MAGNESIA) suspension 30 mL  30 mL Oral Daily PRN [name], NP      . naproxen (NAPROSYN) tablet 500 mg  500 mg Oral BID WC [name], PA-C   500 mg at 03/26/15 0835  . traZODone (DESYREL) tablet 50 mg  50 mg Oral QHS PRN [name], NP        Lab Results:  Results for orders placed or performed during the hospital encounter of 03/24/15 (from the past 48 hour(s))  Hemoglobin A1c     Status: None   Collection Time: 03/25/15  6:42 AM  Result Value Ref Range   Hgb A1c MFr Bld 5.3 4.8 - 5.6 %    Comment: (NOTE)         Pre-diabetes: 5.7 - 6.4         Diabetes: >6.4         Glycemic control for adults with diabetes: <7.0    Mean Plasma Glucose 105 mg/dL    Comment: (NOTE) Performed At: [facility] [place], [place] [id number] Lindon Romp MD [id number] Performed at [facility]   TSH     Status: None   Collection Time: 03/25/15  6:42 AM  Result Value Ref Range   TSH 4.100 0.350 - 4.500 uIU/mL    Comment: Performed at [facility]    Physical Findings: AIMS: Facial and Oral Movements Muscles of Facial Expression: None, normal Lips and Perioral Area: None, normal Jaw: None, normal Tongue: None, normal,Extremity Movements Upper (arms, wrists, hands, fingers): None, normal Lower (legs, knees, ankles, toes): None, normal, Trunk Movements Neck, shoulders, hips: None, normal, Overall Severity Severity of abnormal movements (highest score from questions above): None, normal Incapacitation due to abnormal movements: None, normal Patient's awareness of abnormal movements (rate only patient's report): No Awareness, Dental Status Current problems with teeth and/or dentures?: No Does patient usually wear dentures?: No  CIWA:    COWS:      Assessment- at this time patient reports some improvement. States she is less depressed  and no longer having suicidal ideations. Denies any side effects on Celexa thus far .   Treatment Plan Summary: Daily contact with patient to assess and evaluate symptoms and progress in treatment, Medication management, Plan continue inpatient admission and Continue medications as below Celexa 10 mgrs QAM   Medical Decision Making:  Established Problem, Stable/Improving (1), Review of Psycho-Social Stressors (1), Review or order clinical lab tests (1) and Review of Medication Regimen & Side Effects (2)     COBOS, FERNANDO 03/26/2015, 4:46 PM

## 2015-03-26 NOTE — [facility] Group Notes (Signed)
[facility] LCSW Group Therapy          Overcoming Obstacles       1:15 -2:30        03/26/2015       Type of Therapy:  Group Therapy  Participation Level:  Appropriate  Participation Quality:  Appropriate  Affect: Depressed, Flat  Cognitive:  Appropriate  Insight: Developing/Improving Engaged  Engagement in Therapy: Developing/Imprvoing Engaged  Modes of Intervention:  Discussion Exploration  Education Rapport BuildingProblem-Solving Support  Summary of Progress/Problems:  The main focus of today's group was overcoming obstacles.  Patient shared she feels labeled.  She talked about how friends tend to treat her differently and often to not invite her to functions they are attending. Patient able to identify appropriate coping skills including asking friends to understand she is the same person as before they found out her diagnosis.   [name], Quylle Hairston 03/26/2015

## 2015-03-26 NOTE — [facility] Group Notes (Signed)
[facility] Group Notes:  (Nursing/MHT/Case Management/Adjunct)  Date:  03/26/2015  Time:  0930  Type of Therapy:  Nurse Education  Participation Level:  Active  Participation Quality:  Appropriate  Affect:  Appropriate  Cognitive:  Appropriate  Insight:  Appropriate  Engagement in Group:  Engaged  Modes of Intervention:  Discussion and Education  Summary of Progress/Problems:  [name], Patrice L 03/26/2015, 10:17 AM

## 2015-03-26 NOTE — Progress Notes (Signed)
Patient ID: [name], female   DOB: 01/10/1996, 19 y.o.   MRN: [id number] D: Client is visible on the unit, interacting with peers and staff appropriately. Client reports day has been "good" denies SHI. Client goal: "keep doing things and be around people" A: Writer provided emotional support, encouraged client to continue to interact and reports any concerns to staff. Staff will monitor q3715min for safety. R: client is safe on the unit, attended group.

## 2015-03-26 NOTE — Progress Notes (Signed)
Patient ID: [name], female   DOB: 12/25/1996, 19 y.o.   MRN: [id number]   Pt currently presents with a flat affect and depressed behavior. Per self inventory, pt rates depression at a 7, hopelessness 2 and anxiety 6. Pt's daily goal is to "I woke up feeling pretty anxious and bad, so just coming back from that" and they intend to do so by "just keep doing things." Pt reports fair sleep, poor concentration and a poor appetite. Pt also reports low energy. Pt reports "menstrual cramp pains" throughout the day today.   Pt provided with medications per providers orders. Pt's labs and vitals were monitored throughout the day. Pt supported emotionally and encouraged to express concerns and questions. Pt educated on medications.  Pt's safety ensured with 15 minute and environmental checks. Pt currently denies SI/HI and A/V hallucinations. Pt verbally agrees to seek staff if SI/HI or A/VH occurs and to consult with staff before acting on these thoughts. Pt smiles and asks writer "Can I have a friend visit me, if she has my code?" Pt encouraged to reach out to positive support systems.

## 2015-03-27 NOTE — Plan of Care (Signed)
Problem: Alteration in mood Goal: LTG-Pt's behavior demonstrates decreased signs of depression 3/28: Goal met: Patient's behavior demonstrates decreased signs of depression to the point the patient is safe to return home and continue treatment in an outpatient setting. Patient rates depression at 3 today.  Tilden Fossa, MSW, [place] Clinical Social Worker [facility] [phone] Outcome: Completed/Met Date Met:  03/27/15 Pt in the milieu throughout the day and actively participates in groups. Pt seen laughing and smiling in the dayroom.

## 2015-03-27 NOTE — [facility] Group Notes (Signed)
[facility] LCSW Group Therapy  Emotional Regulation 1:15 - 2: 30 PM        03/27/2015     Type of Therapy:  Group Therapy  Participation Level:  Appropriate  Participation Quality:  Appropriate  Affect:  Appropriate  Cognitive:  Attentive Appropriate  Insight:  Developing/Improving Engaged  Engagement in Therapy:  Developing/Improving Engaged  Modes of Intervention:  Discussion Exploration Problem-Solving Supportive  Summary of Progress/Problems:  Group topic was emotional regulations.  Patient participated in the discussion and was able to identify an emotion that needed to regulated as frustration.  She shared she has to learn the source of her feelings - where it is coming from.  Patient shared with patient whose son committed suicide her thanks for the openness she shared in group.  Patient's stated never again will she attempt suicide as she now understands what her mother could have been experiencing.  Patient stated she would never want her mother to go through the pain and guilt other patient is experiencing.   Patient was able to identify approprite coping skills.  [name], [name] 03/27/2015

## 2015-03-27 NOTE — Progress Notes (Signed)
Recreation Therapy Notes  Date: 03.30.2016 Time: 9:30am Location: [address] Group Room   Group Topic: Stress Management  Goal Area(s) Addresses:  Patient will actively participate in stress management techniques presented during session.   Behavioral Response: Did not attend.   Denise L Blanchfield, LRT/CTRS  Blanchfield, Denise L 03/27/2015 3:10 PM 

## 2015-03-27 NOTE — Progress Notes (Signed)
Patient ID: [name], female   DOB: June 27, 1996, 19 y.o.   MRN: [id number] [facility] MD Progress Note  03/27/2015 4:50 PM [name]  MRN:  [id number] Subjective:   Patient states she is feeling better and that at this time she is not feeling depressed at all. Denies any lingering suicidal ideations and  Is future oriented, wanting to go back to college in [place] soon after discharge. She states she is happy because she found out her outpatient therapist in [place], with who  She has a good rapport, is able to continue seeing her after discharge.  Objective : I have discussed case with treatment team and have met with patient. Patient feeling better and presenting euthymic, with a full range of affect. States " this is the best I have felt in months". Denies neuro-vegetative symptoms of depression at this time and denies anhedonia, sadness or low self esteem. Denies any SI, and has not exhibited any self injurious behaviors on unit. She is tolerating Celexa well, denies side effects. With her express consent and in her presence I spoke with her mother on phone and reviewed patient's current improvement/presentation ( mother has visited and agrees patient seems improved ) , current medication regimen, and possible discharge plans. Mother wondered if patient could have increased depression/dysphoria /SI on week prior to Menses- I asked patient about this and she does not think there is any correlation. She does not endorse premenstrual dysphoria at this time Patient is visible on unit and going to groups.   Principal Problem: Major depressive disorder, recurrent severe without psychotic features Diagnosis:   Patient Active Problem List   Diagnosis Date Noted  . Major depressive disorder, recurrent severe without psychotic features [F33.2] 03/25/2015   Total Time spent with patient: 25 minutes    Past Medical History:  Past Medical History  Diagnosis Date  . Medical history  non-contributory     Past Surgical History  Procedure Laterality Date  . Tympanostomy tube placement     Family History: History reviewed. No pertinent family history. Social History:  History  Alcohol Use No     History  Drug Use No    History   Social History  . Marital Status: Single    Spouse Name: N/A  . Number of Children: N/A  . Years of Education: N/A   Social History Main Topics  . Smoking status: Never Smoker   . Smokeless tobacco: Not on file  . Alcohol Use: No  . Drug Use: No  . Sexual Activity: No   Other Topics Concern  . None   Social History Narrative   Additional History:    Sleep: improved   Appetite:  Improved    Assessment:   Musculoskeletal: Strength & Muscle Tone: within normal limits Gait & Station: normal Patient leans: N/A   Psychiatric Specialty Exam: Physical Exam  Review of Systems  Constitutional: Negative for fever and chills.  Respiratory: Negative for cough and shortness of breath.   Cardiovascular: Negative for chest pain.  Gastrointestinal: Negative for vomiting and abdominal pain.  Genitourinary: Negative for dysuria, urgency and frequency.  Musculoskeletal: Negative for myalgias and back pain.  Skin: Negative for rash.  Neurological: Negative for seizures.  Psychiatric/Behavioral: Positive for depression.    Blood pressure 119/69, pulse 73, temperature 98.2 F (36.8 C), temperature source Oral, resp. rate 20, height 5' 3"  (1.6 m), weight 139 lb (63.05 kg), last menstrual period 02/28/2015.Body mass index is 24.63 kg/(m^2).  General Appearance: Well  Groomed  Engineer, water::  Good  Speech:  Normal Rate  Volume:  Normal  Mood:  describes normal mood and appears euthymic   Affect:  Appropriate and Full Range  Thought Process:  Goal Directed and Linear  Orientation:  Full (Time, Place, and Person)  Thought Content:  Denies hallucinations, no delusions, not internally preoccupied   Suicidal Thoughts:  No At this time  denies any suicidal plan or intent and contracts for safety on the unit  Homicidal Thoughts:  No  Memory:  recent and remote grossly intact   Judgement:  Fair  Insight:  Fair  Psychomotor Activity:  Normal  Concentration:  Good  Recall:  Good  Fund of Knowledge:Good  Language: Good  Akathisia:  Negative  Handed:  Right  AIMS (if indicated):     Assets:  Desire for Improvement Resilience  ADL's:  Improving   Cognition: WNL  Sleep:  Number of Hours: 5.75     Current Medications: Current Facility-Administered Medications  Medication Dose Route Frequency Provider Last Rate Last Dose  . acetaminophen (TYLENOL) tablet 650 mg  650 mg Oral Q6H PRN [name], NP      . alum & mag hydroxide-simeth (MAALOX/MYLANTA) 200-200-20 MG/5ML suspension 30 mL  30 mL Oral Q4H PRN [name], NP      . citalopram (CELEXA) tablet 10 mg  10 mg Oral Daily [name], MD   10 mg at 03/27/15 0807  . magnesium hydroxide (MILK OF MAGNESIA) suspension 30 mL  30 mL Oral Daily PRN [name], NP      . naproxen (NAPROSYN) tablet 500 mg  500 mg Oral BID WC [name], PA-C   500 mg at 03/26/15 0835  . traZODone (DESYREL) tablet 50 mg  50 mg Oral QHS PRN [name], NP        Lab Results:  No results found for this or any previous visit (from the past 99 hour(s)).  Physical Findings: AIMS: Facial and Oral Movements Muscles of Facial Expression: None, normal Lips and Perioral Area: None, normal Jaw: None, normal Tongue: None, normal,Extremity Movements Upper (arms, wrists, hands, fingers): None, normal Lower (legs, knees, ankles, toes): None, normal, Trunk Movements Neck, shoulders, hips: None, normal, Overall Severity Severity of abnormal movements (highest score from questions above): None, normal Incapacitation due to abnormal movements: None, normal Patient's awareness of abnormal movements (rate only patient's report): No Awareness, Dental Status Current problems with  teeth and/or dentures?: No Does patient usually wear dentures?: No  CIWA:    COWS:      Assessment- at this time patient  Reports feeling much better and denies any ongoing depression. Mood seems euthymic and does not endorse neurovegetative symptoms of depression or SI. Tolerating Celexa well thus far.  Treatment Plan Summary: Daily contact with patient to assess and evaluate symptoms and progress in treatment, Medication management, Plan continue inpatient admission and Continue medications as belowContinue Celexa 10 mgrs QAM   Medical Decision Making:  Established Problem, Stable/Improving (1), Review of Psycho-Social Stressors (1), Review or order clinical lab tests (1) and Review of Medication Regimen & Side Effects (2)     COBOS, FERNANDO 03/27/2015, 4:50 PM

## 2015-03-27 NOTE — Progress Notes (Signed)
Pt currently presents with a flat affect and depressed behavior. Per self inventory, pt rates depression, hopelessness and anxiety at a 0. Pt's daily goal is "staying as upbeat and vibrant as I was by mid-day yesterday" and they intend to do so by "keep putting myself around others and doing things." Pt reports fair sleep, good concentration and a poor appetite.   Pt provided with medications per providers orders. Pt's labs and vitals were monitored throughout the day. Pt supported emotionally and encouraged to express concerns and questions. Pt educated on medications.  Pt's safety ensured with 15 minute and environmental checks. Pt currently denies SI/HI and A/V hallucinations. Pt verbally agrees to seek staff if SI/HI or A/VH occurs and to consult with staff before acting on these thoughts. Pt present in milieu and active in groups today.

## 2015-03-28 MED ORDER — CITALOPRAM HYDROBROMIDE 10 MG PO TABS: 10.0000 mg | ORAL_TABLET | Freq: Every day | ORAL | Status: DC

## 2015-03-28 MED ORDER — TRAZODONE HCL 50 MG PO TABS: 50.0000 mg | ORAL_TABLET | Freq: Every evening | ORAL | Status: DC | PRN

## 2015-03-28 NOTE — Progress Notes (Signed)
Discharge instructions: Pt received both written and verbal discharge instructions. Pt verbalized understanding of discharge instructions. Pt agreed to f/u appt and med regimen. Pt received sample meds and prescriptions. Pt received belongings from room and locker.  Pt denies suicidal thoughts at time of discharge. Pt safely left [facility] with her mother.

## 2015-03-28 NOTE — Discharge Summary (Signed)
Physician Discharge Summary Note  Patient:  [name] is an 19 y.o., female MRN:  [id number] DOB:  1996-05-11 Patient phone:  [phone] (home)  Patient address:   [address] [place] [address],  Total Time spent with patient: 30 minutes  Date of Admission:  03/24/2015 Date of Discharge: 03/28/2015  Reason for Admission:  depression  Principal Problem: Major depressive disorder, recurrent severe without psychotic features Discharge Diagnoses: Patient Active Problem List   Diagnosis Date Noted  . Major depressive disorder, recurrent severe without psychotic features [F33.2] 03/25/2015    Musculoskeletal: Strength & Muscle Tone: within normal limits Gait & Station: normal Patient leans: N/A  Psychiatric Specialty Exam:  SEE SRA Physical Exam  Vitals reviewed.   Review of Systems  Constitutional: Negative for fever and chills.  Cardiovascular: Negative for chest pain.  All other systems reviewed and are negative.   Blood pressure 119/69, pulse 73, temperature 98.2 F (36.8 C), temperature source Oral, resp. rate 20, height  (1.6 m), weight 63.05 kg (139 lb), last menstrual period 02/28/2015.Body mass index is 24.63 kg/(m^2).   Past Medical History:  Past Medical History  Diagnosis Date  . Medical history non-contributory     Past Surgical History  Procedure Laterality Date  . Tympanostomy tube placement     Family History: History reviewed. No pertinent family history. Social History:  History  Alcohol Use No     History  Drug Use No    History   Social History  . Marital Status: Single    Spouse Name: N/A  . Number of Children: N/A  . Years of Education: N/A   Social History Main Topics  . Smoking status: Never Smoker   . Smokeless tobacco: Not on file  . Alcohol Use: No  . Drug Use: No  . Sexual Activity: No   Other Topics Concern  . None   Social History Narrative   Risk to Self: Is patient at risk for suicide?:  Yes Risk to Others:   Prior Inpatient Therapy:   Prior Outpatient Therapy:    Level of Care:  OP  Hospital Course:  LAURIE PENADO was admitted for Major depressive disorder, recurrent severe without psychotic features and crisis management.  She was treated discharged with the medications listed below under Medication List.  Medical problems were identified and treated as needed.  Home medications were restarted as appropriate.  Improvement was monitored by observation and Franky Macho daily report of symptom reduction.  Emotional and mental status was monitored by daily self-inventory reports completed by Franky Macho and clinical staff.         Franky Macho was evaluated by the treatment team for stability and plans for continued recovery upon discharge.  Franky Macho motivation was an integral factor for scheduling further treatment.  Employment, transportation, bed availability, health status, family support, and any pending legal issues were also considered during her hospital stay.  She was offered further treatment options upon discharge including but not limited to Residential, Intensive Outpatient, and Outpatient treatment.  Franky Macho will follow up with the services as listed below under Follow Up Information.     Upon completion of this admission the patient was both mentally and medically stable for discharge denying suicidal/homicidal ideation, auditory/visual/tactile hallucinations, delusional thoughts and paranoia.      Consults:  psychiatry  Significant Diagnostic Studies:  labs: per ED  Discharge Vitals:   Blood pressure 119/69, pulse 73, temperature  98.2 F (36.8 C), temperature source Oral, resp. rate 20, height 5\' 3"  (1.6 m), weight 63.05 kg (139 lb), last menstrual period 02/28/2015. Body mass index is 24.63 kg/(m^2). Lab Results:   No results found for this or any previous visit (from the past 72 hour(s)).  Physical Findings: AIMS: Facial and Oral  Movements Muscles of Facial Expression: None, normal Lips and Perioral Area: None, normal Jaw: None, normal Tongue: None, normal,Extremity Movements Upper (arms, wrists, hands, fingers): None, normal Lower (legs, knees, ankles, toes): None, normal, Trunk Movements Neck, shoulders, hips: None, normal, Overall Severity Severity of abnormal movements (highest score from questions above): None, normal Incapacitation due to abnormal movements: None, normal Patient's awareness of abnormal movements (rate only patient's report): No Awareness, Dental Status Current problems with teeth and/or dentures?: No Does patient usually wear dentures?: No  CIWA:    COWS:      See Psychiatric Specialty Exam and Suicide Risk Assessment completed by Attending Physician prior to discharge.  Discharge destination:  Home  Is patient on multiple antipsychotic therapies at discharge:  No   Has Patient had three or more failed trials of antipsychotic monotherapy by history:  No    Recommended Plan for Multiple Antipsychotic Therapies: NA     Medication List    STOP taking these medications        ibuprofen 600 MG tablet  Commonly known as:  ADVIL,MOTRIN      TAKE these medications      Indication   citalopram 10 MG tablet  Commonly known as:  CELEXA  Take 1 tablet (10 mg total) by mouth daily.   Indication:  Depression     traZODone 50 MG tablet  Commonly known as:  DESYREL  Take 1 tablet (50 mg total) by mouth at bedtime as needed for sleep.   Indication:  Trouble Sleeping           Follow-up Information    [facility]      [facility] On 03/29/2015.   Why:  You are scheduled with Dr. Peggyann Juba'Sullivan on Friday, March 29, 2015 at [facility]8AM   Contact information:   [address] [place], [place]   [id number]  [phone]      [facility] On 04/12/2015.   Why:  You are scheduled with Molli BarrowsJennie Smith on Friday, April 12, 2015 at [facility]10AM    Contact information:   [address] [place], [place]  [address]  [phone]      Follow-up recommendations:  Activity:  as tol, diet as tol  Comments:  1.  Take all your medications as prescribed.              2.  Report any adverse side effects to outpatient provider.                       3.  Patient instructed to not use alcohol or illegal drugs while on prescription medicines.            4.  In the event of worsening symptoms, instructed patient to call 911, the crisis hotline or go to nearest emergency room for evaluation of symptoms.  Total Discharge Time: 30 min  Signed: Velna HatchetSheila May Agustin AGNP-BC 03/28/2015, 2:45 PM   Patient seen, Suicide Assessment Completed.  Disposition Plan Reviewed

## 2015-03-28 NOTE — Progress Notes (Signed)
D: Pt presents flat in affect but brightens upon interaction. Pt's mood is appropriate. Pt verbalizes readiness to return back to school. Pt reports that she has gained appreciation of her own life from [facility] the stories [facility]. Pt is observed actively participating in the milieu. Pt is currently negative for any SI/HI/AVH. Pt reports having a good support system.  A: Writer administered scheduled and prn medications to pt. Continued support and availability as needed was extended to this pt. Staff continue to monitor pt with q7415min checks.  R: No adverse drug reactions noted. Pt receptive to treatment. Pt remains safe at this time.

## 2015-03-28 NOTE — [facility] Group Notes (Signed)
The focus of this group is to educate the patient on the purpose and policies of crisis stabilization and provide a format to answer questions about their admission.  The group details unit policies and expectations of patients while admitted. Patient attended this group and was engaging. 

## 2015-03-28 NOTE — [facility] Suicide Risk Assessment (Signed)
[facility]   Demographic Factors:  19 year old female, single, college student Dentist( studying Animal Science )   Total Time spent with patient: 30 minutes  Musculoskeletal: Strength & Muscle Tone: within normal limits Gait & Station: normal Patient leans: N/A  Psychiatric Specialty Exam: Physical Exam  ROS  Blood pressure 119/69, pulse 73, temperature 98.2 F (36.8 C), temperature source Oral, resp. rate 20, height 5\' 3"  (1.6 m), weight 139 lb (63.05 kg), last menstrual period 02/28/2015.Body mass index is 24.63 kg/(m^2).  General Appearance: improved grooming  Eye Contact::  Good  Speech:  Normal Rate  Volume:  Normal  Mood:  Euthymic  Affect:  Full Range  Thought Process:  Linear  Orientation:  Full (Time, Place, and Person)  Thought Content:  no hallucinations, no delusions  Suicidal Thoughts:  No- denies any thoughts of hurting self or anyone else   Homicidal Thoughts:  No  Memory:  Recent and remote grossly intact   Judgement:  Other:  improved   Insight:  improved  Psychomotor Activity:  Normal  Concentration:  Good  Recall:  Good  Fund of Knowledge:Good  Language: Good  Akathisia:  Negative  Handed:  Right  AIMS (if indicated):     Assets:  Communication Skills Desire for Improvement Physical Health Resilience  Sleep:  Number of Hours: 6.25  Cognition: WNL  ADL's:  Improved    Have you used any form of tobacco in the last 30 days? (Cigarettes, Smokeless Tobacco, Cigars, and/or Pipes): No  Has this patient used any form of tobacco in the last 30 days? (Cigarettes, Smokeless Tobacco, Cigars, and/or Pipes) No  Mental Status Per Nursing Assessment::   On Admission:  Suicidal ideation indicated by patient  Current Mental Status by Physician: At this time patient is improved compared to admission. Mood is described as normal and seems euthymic, affect is appropriate, full in range, no thought disorder, no SI , no HI, denies any self  injurious ideations, no hallucinations, no delusions, future oriented, looking forward to returning to college and seeing her friends   Loss Factors: Did not identify any clear stressors that resulted in increased depression  Historical Factors: History of Depression, history of suicidal ideations in the past, this was first psychiatric admission  Risk Reduction Factors:   Sense of responsibility to family, Positive social support, Positive therapeutic relationship and Positive coping skills or problem solving skills  Continued Clinical Symptoms:  At this time patient is much improved compared to admission, as above. Denies any current medication side effects.  Cognitive Features That Contribute To Risk:  No gross cognitive deficits noted upon discharge. Is alert , attentive, and oriented x 3   Suicide Risk:  Mild:  Suicidal ideation of limited frequency, intensity, duration, and specificity.  There are no identifiable plans, no associated intent, mild dysphoria and related symptoms, good self-control (both objective and subjective assessment), few other risk factors, and identifiable protective factors, including available and accessible social support.  Principal Problem: Major depressive disorder, recurrent severe without psychotic features Discharge Diagnoses:  Patient Active Problem List   Diagnosis Date Noted  . Major depressive disorder, recurrent severe without psychotic features [F33.2] 03/25/2015    Follow-up Information    [facility]      [facility] On 03/29/2015.   Why:  You are scheduled with Dr. Peggyann Juba'Sullivan on Friday, March 29, 2015 at [facility]8AM   Contact information:   [address] [place], [place]  [address]  [phone]      Follow up with [facility]- [facility] On 04/12/2015.   Why:  You are scheduled with Molli Barrows on Friday, April 12, 2015 at [facility] information:   [address] [place], [place]  [address]  [phone]       Plan Of Care/Follow-up recommendations:  Activity:  As tolerated Diet:  Regular Tests:  NA Other:  See below  Is patient on multiple antipsychotic therapies at discharge:  No   Has Patient had three or more failed trials of antipsychotic monotherapy by history:  No  Recommended Plan for Multiple Antipsychotic Therapies: NA   Patient is leaving unit in good spirits. Parents are picking her up. Plans to return to college soon. Follow up as above.     COBOS, FERNANDO 03/28/2015, 9:48 AM

## 2015-03-28 NOTE — Progress Notes (Signed)
  [facility] :  Will you be returning to the same living situation after discharge:  Yes,  Patient returning to school. At discharge, do you have transportation home?: Yes,  Parents transported patient home. Do you have the ability to pay for your medications: Yes,  Patient able to afford medications. or No.  Release of information consent forms completed and in the chart;  Patient's signature needed at discharge.  Patient to Follow up at: Follow-up Information    [facility]      [facility] On 03/29/2015.   Why:  You are scheduled with Dr. Peggyann Juba'Sullivan on Friday, March 29, 2015 at [facility]   Contact information:   [address] [place], [place]   [id number]  [phone]      Follow up with [facility] On 04/12/2015.   Why:  You are scheduled with Molli BarrowsJennie Smith on Friday, April 12, 2015 at [facility]10AM   Contact information:   [address] [place], [place]  [address]  [phone]      Patient denies SI/HI: Patient no longer endorsing SI/HI or other thoughts of self harm.     Safety Planning and Suicide Prevention discussed:  .Reviewed with all patients during discharge planning group   Have you used any form of tobacco in the last 30 days? (Cigarettes, Smokeless Tobacco, Cigars, and/or Pipes): No  Has patient been referred to the Quitline?: No referral needed to quitline.   [name], [name] 03/28/2015, 2:31 PM

## 2015-04-01 NOTE — Progress Notes (Signed)
Patient Discharge Instructions:  After Visit Summary (AVS):   Faxed to:  04/01/15 Discharge Summary Note:   Faxed to:  04/01/15 Psychiatric Admission Assessment Note:   Faxed to:  04/01/15 Suicide Risk Assessment - Discharge Assessment:   Faxed to:  04/01/15 Faxed/Sent to the Next Level Care provider:  04/01/15 Faxed to [facility] @ [phone] Faxed to [facility] @ [phone]  Jerelene ReddenSheena E Chambers, 04/01/2015, 2:25 PM

## 2018-12-01 ENCOUNTER — Emergency Department (HOSPITAL_COMMUNITY)
Admission: EM | Admit: 2018-12-01 | Discharge: 2018-12-01 | Disposition: A | Discharge status: Home / Self Care | Payer: BLUE CROSS/BLUE SHIELD | Attending: Emergency Medicine | Admitting: Emergency Medicine

## 2018-12-01 ENCOUNTER — Encounter (HOSPITAL_COMMUNITY): Payer: Self-pay | Admitting: *Deleted

## 2018-12-01 ENCOUNTER — Other Ambulatory Visit: Payer: Self-pay

## 2018-12-01 DIAGNOSIS — J208 Acute bronchitis due to other specified organisms: Secondary | ICD-10-CM

## 2018-12-01 DIAGNOSIS — R1084 Generalized abdominal pain: Secondary | ICD-10-CM

## 2018-12-01 LAB — URINALYSIS, MICROSCOPIC (REFLEX)

## 2018-12-01 LAB — CBC WITH DIFFERENTIAL/PLATELET
Abs Immature Granulocytes: 0.02 10*3/uL (ref 0.00–0.07)
Basophils Absolute: 0.1 10*3/uL (ref 0.0–0.1)
Basophils Relative: 1 %
Eosinophils Absolute: 0.1 10*3/uL (ref 0.0–0.5)
Eosinophils Relative: 1 %
HCT: 43.1 % (ref 36.0–46.0)
Hemoglobin: 13.9 g/dL (ref 12.0–15.0)
Immature Granulocytes: 0 %
Lymphocytes Relative: 20 %
Lymphs Abs: 1.8 10*3/uL (ref 0.7–4.0)
MCH: 26.9 pg (ref 26.0–34.0)
MCHC: 32.3 g/dL (ref 30.0–36.0)
MCV: 83.5 fL (ref 80.0–100.0)
Monocytes Absolute: 0.6 10*3/uL (ref 0.1–1.0)
Monocytes Relative: 7 %
Neutro Abs: 6.3 10*3/uL (ref 1.7–7.7)
Neutrophils Relative %: 71 %
Platelets: 368 10*3/uL (ref 150–400)
RBC: 5.16 MIL/uL — ABNORMAL HIGH (ref 3.87–5.11)
RDW: 12.1 % (ref 11.5–15.5)
WBC: 8.9 10*3/uL (ref 4.0–10.5)
nRBC: 0 % (ref 0.0–0.2)

## 2018-12-01 LAB — POC OCCULT BLOOD, ED: Fecal Occult Bld: NEGATIVE

## 2018-12-01 LAB — COMPREHENSIVE METABOLIC PANEL
ALT: 34 U/L (ref 0–44)
AST: 33 U/L (ref 15–41)
Albumin: 4.3 g/dL (ref 3.5–5.0)
Alkaline Phosphatase: 40 U/L (ref 38–126)
Anion gap: 16 — ABNORMAL HIGH (ref 5–15)
BUN: 9 mg/dL (ref 6–20)
CO2: 20 mmol/L — ABNORMAL LOW (ref 22–32)
Calcium: 9.5 mg/dL (ref 8.9–10.3)
Chloride: 106 mmol/L (ref 98–111)
Creatinine, Ser: 0.91 mg/dL (ref 0.44–1.00)
GFR calc Af Amer: >60 mL/min (ref >60)
GFR calc non Af Amer: >60 mL/min (ref >60)
Glucose, Bld: 107 mg/dL — ABNORMAL HIGH (ref 70–99)
Potassium: 3.1 mmol/L — ABNORMAL LOW (ref 3.5–5.1)
Sodium: 142 mmol/L (ref 135–145)
Total Bilirubin: 0.7 mg/dL (ref 0.3–1.2)
Total Protein: 7.8 g/dL (ref 6.5–8.1)

## 2018-12-01 LAB — URINALYSIS, ROUTINE W REFLEX MICROSCOPIC
Glucose, UA: NEGATIVE mg/dL
Ketones, ur: >80 mg/dL — AB
Leukocytes, UA: NEGATIVE
Nitrite: NEGATIVE
Protein, ur: NEGATIVE mg/dL
Specific Gravity, Urine: 1.025 (ref 1.005–1.030)
pH: 6 (ref 5.0–8.0)

## 2018-12-01 LAB — I-STAT BETA HCG BLOOD, ED (MC, WL, AP ONLY): I-stat hCG, quantitative: <5 m[IU]/mL (ref <5)

## 2018-12-01 LAB — LIPASE, BLOOD: Lipase: 25 U/L (ref 11–51)

## 2018-12-01 MED ORDER — IPRATROPIUM-ALBUTEROL 0.5-2.5 (3) MG/3ML IN SOLN
3.0000 mL | Freq: Once | RESPIRATORY_TRACT | Status: AC
  Administered 2018-12-01: 3 mL via RESPIRATORY_TRACT
  Filled 2018-12-01: qty 3

## 2018-12-01 MED ORDER — ALBUTEROL SULFATE HFA 108 (90 BASE) MCG/ACT IN AERS: 1.0000 | INHALATION_SPRAY | RESPIRATORY_TRACT | Status: DC | PRN

## 2018-12-01 MED ORDER — PANTOPRAZOLE SODIUM 20 MG PO TBEC: 20.0000 mg | DELAYED_RELEASE_TABLET | Freq: Every day | ORAL | 0 refills | Status: DC

## 2018-12-01 MED ORDER — DEXAMETHASONE 4 MG PO TABS: 8.0000 mg | ORAL_TABLET | Freq: Every day | ORAL | 0 refills | Status: DC

## 2018-12-01 MED ORDER — SODIUM CHLORIDE 0.9 % IV BOLUS
1000.0000 mL | Freq: Once | INTRAVENOUS | Status: AC
  Administered 2018-12-01: 1000 mL via INTRAVENOUS

## 2018-12-01 MED ORDER — SODIUM CHLORIDE 0.9 % IV SOLN
80.0000 mg | Freq: Once | INTRAVENOUS | Status: AC
  Administered 2018-12-01: 80 mg via INTRAVENOUS
  Filled 2018-12-01: qty 80

## 2018-12-01 NOTE — ED Notes (Signed)
Declined W/C at D/C and was escorted to lobby by RN. 

## 2018-12-01 NOTE — ED Provider Notes (Addendum)
[facility] Note   CSN: [id number] Arrival date & time: 12/01/18  1135     History   Chief Complaint Chief Complaint  Patient presents with  . Abdominal Pain    HPI [name] is a 22 y.o. female.  HPI   22 year old female with several complaints.  Occasional cough.  She feels like she has been wheezing.  She says she was seen in urgent care on Monday, diagnosed with the fluid started on Tamiflu.  Her symptoms have persisted.  She has also been having intermittent abdominal pain in the past several days.  Comes in waves.  Very sharp at times.  Worse shortly after eating.  This morning she had a dark black stool but also appeared to have some gross blood in it as well.  She reports a past history of ulcers and states that she was on medication for them previously, but none currently.  Denies significant NSAID usage.  Past Medical History:  Diagnosis Date  . Medical history non-contributory     Patient Active Problem List   Diagnosis Date Noted  . Major depressive disorder, recurrent severe without psychotic features (HCC) 03/25/2015    Past Surgical History:  Procedure Laterality Date  . TYMPANOSTOMY TUBE PLACEMENT       OB History   None      Home Medications    Prior to Admission medications   Medication Sig Start Date End Date Taking? Authorizing Provider  citalopram (CELEXA) 10 MG tablet Take 1 tablet (10 mg total) by mouth daily. 03/28/15   Adonis BrookAgustin, Sheila, NP  traZODone (DESYREL) 50 MG tablet Take 1 tablet (50 mg total) by mouth at bedtime as needed for sleep. 03/28/15   Adonis BrookAgustin, Sheila, NP    Family History History reviewed. No pertinent family history.  Social History Social History   Tobacco Use  . Smoking status: Never Smoker  . Smokeless tobacco: Never Used  Substance Use Topics  . Alcohol use: No  . Drug use: No     Allergies   Patient has no known allergies.   Review of  Systems Review of Systems  All systems reviewed and negative, other than as noted in HPI.  Physical Exam Updated Vital Signs BP 134/80 (BP Location: Right Arm)   Pulse (!) 112   Temp 98.5 F (36.9 C) (Oral)   Resp 14   Ht 5\' 5"  (1.651 m)   Wt 73.9 kg   LMP 12/01/2018   SpO2 100%   BMI 27.12 kg/m   Physical Exam  Constitutional: She appears well-developed and well-nourished. No distress.  HENT:  Head: Normocephalic and atraumatic.  Eyes: Conjunctivae are normal. Right eye exhibits no discharge. Left eye exhibits no discharge.  Neck: Neck supple.  Cardiovascular: Regular rhythm and normal heart sounds. Exam reveals no gallop and no friction rub.  No murmur heard. Mild tachycardia  Pulmonary/Chest: Effort normal. No respiratory distress. She has wheezes.  Faint and expiratory wheezing bilaterally  Abdominal: Soft. She exhibits no distension. There is tenderness.  Mild to moderate periumbilical and epigastric tenderness without rebound or guarding.  Musculoskeletal: She exhibits no edema or tenderness.  Neurological: She is alert.  Skin: Skin is warm and dry.  Psychiatric: She has a normal mood and affect. Her behavior is normal. Thought content normal.  Nursing note and vitals reviewed.    ED Treatments / Results  Labs (all labs ordered are listed, but only abnormal results are displayed) Labs Reviewed  CBC WITH DIFFERENTIAL/PLATELET - Abnormal; Notable for the following components:      Result Value   RBC 5.16 (*)    All other components within normal limits  COMPREHENSIVE METABOLIC PANEL - Abnormal; Notable for the following components:   Potassium 3.1 (*)    CO2 20 (*)    Glucose, Bld 107 (*)    Anion gap 16 (*)    All other components within normal limits  LIPASE, BLOOD  OCCULT BLOOD X 1 CARD TO LAB, STOOL  URINALYSIS, ROUTINE W REFLEX MICROSCOPIC  I-STAT BETA HCG BLOOD, ED ([facility], [facility], [facility] ONLY)  POC OCCULT BLOOD, ED    EKG None  Radiology No results  found.  Procedures Procedures (including critical care time)  Medications Ordered in ED Medications  ipratropium-albuterol (DUONEB) 0.5-2.5 (3) MG/3ML nebulizer solution 3 mL (has no administration in time range)  pantoprazole (PROTONIX) 80 mg in sodium chloride 0.9 % 100 mL IVPB (has no administration in time range)  sodium chloride 0.9 % bolus 1,000 mL (has no administration in time range)     Initial Impression / Assessment and Plan / ED Course  I have reviewed the triage vital signs and the nursing notes.  Pertinent labs & imaging results that were available during my care of the patient were reviewed by me and considered in my medical decision making (see chart for details).     22yf with several complaints.  Upper abdominal pain with reported black stool with blood.  Hemoglobin is normal at 13.9.  Hemoccult negative. BUN is normal as well which would argue against upper GI bleed.  Symptoms concerning enough to place on a PPI though.  Cough and shortness of breath.  She does have some mild expiratory wheezing on exam.  No accessory muscle usage.  o2 sats 100% on RA. Will place her on Decadron for acouple more days.  She says she already has an albuterol inhaler.  Likely viral bronchitis.   Minimal hypokalemia. Likely non-contributory and may be transient depending on timing of blood draw and getting albuterol in the ED.   UA noted. Abdominal pain more upper and no specific urinary complaints. Will send urine culture.   Final Clinical Impressions(s) / ED Diagnoses   Final diagnoses:  Viral bronchitis  Generalized abdominal pain    ED Discharge Orders    None           Raeford Razor, MD 12/01/18 1420

## 2018-12-01 NOTE — ED Triage Notes (Signed)
PT reports SHOB started on Sunday,ABD Pain started Today. Pt alsol reports bloody stools today . [facility]  And Dx with Flu and started on Tamaflu.

## 2018-12-02 LAB — URINE CULTURE: Culture: NO GROWTH

## 2019-10-18 ENCOUNTER — Other Ambulatory Visit: Payer: Self-pay

## 2019-10-18 DIAGNOSIS — Z20822 Contact with and (suspected) exposure to covid-19: Secondary | ICD-10-CM

## 2019-10-20 LAB — NOVEL CORONAVIRUS, NAA: SARS-CoV-2, NAA: NOT DETECTED

## 2020-01-22 ENCOUNTER — Ambulatory Visit: Payer: Self-pay | Attending: Internal Medicine

## 2020-01-22 DIAGNOSIS — Z20822 Contact with and (suspected) exposure to covid-19: Secondary | ICD-10-CM | POA: Insufficient documentation

## 2020-01-23 LAB — NOVEL CORONAVIRUS, NAA: SARS-CoV-2, NAA: NOT DETECTED

## 2020-04-07 ENCOUNTER — Encounter (HOSPITAL_COMMUNITY): Payer: Self-pay

## 2020-04-07 ENCOUNTER — Emergency Department (HOSPITAL_COMMUNITY)
Admission: EM | Admit: 2020-04-07 | Discharge: 2020-04-08 | Disposition: A | Discharge status: Other Acute Inpatient Hospital | Payer: Self-pay | Attending: Emergency Medicine | Admitting: Emergency Medicine

## 2020-04-07 ENCOUNTER — Other Ambulatory Visit: Payer: Self-pay

## 2020-04-07 DIAGNOSIS — F419 Anxiety disorder, unspecified: Secondary | ICD-10-CM | POA: Insufficient documentation

## 2020-04-07 DIAGNOSIS — R45851 Suicidal ideations: Secondary | ICD-10-CM | POA: Insufficient documentation

## 2020-04-07 DIAGNOSIS — Z79899 Other long term (current) drug therapy: Secondary | ICD-10-CM | POA: Insufficient documentation

## 2020-04-07 DIAGNOSIS — F1220 Cannabis dependence, uncomplicated: Secondary | ICD-10-CM | POA: Insufficient documentation

## 2020-04-07 DIAGNOSIS — F332 Major depressive disorder, recurrent severe without psychotic features: Secondary | ICD-10-CM | POA: Insufficient documentation

## 2020-04-07 DIAGNOSIS — Z20822 Contact with and (suspected) exposure to covid-19: Secondary | ICD-10-CM | POA: Insufficient documentation

## 2020-04-07 HISTORY — DX: Anxiety disorder, unspecified: F41.9

## 2020-04-07 HISTORY — DX: Depression, unspecified: F32.A

## 2020-04-07 LAB — CBC
HCT: 39.5 % (ref 36.0–46.0)
Hemoglobin: 13.6 g/dL (ref 12.0–15.0)
MCH: 29.1 pg (ref 26.0–34.0)
MCHC: 34.4 g/dL (ref 30.0–36.0)
MCV: 84.4 fL (ref 80.0–100.0)
Platelets: 376 10*3/uL (ref 150–400)
RBC: 4.68 MIL/uL (ref 3.87–5.11)
RDW: 11.7 % (ref 11.5–15.5)
WBC: 15 10*3/uL — ABNORMAL HIGH (ref 4.0–10.5)
nRBC: 0 % (ref 0.0–0.2)

## 2020-04-07 LAB — COMPREHENSIVE METABOLIC PANEL
ALT: 16 U/L (ref 0–44)
AST: 16 U/L (ref 15–41)
Albumin: 4.3 g/dL (ref 3.5–5.0)
Alkaline Phosphatase: 39 U/L (ref 38–126)
Anion gap: 10 (ref 5–15)
BUN: 13 mg/dL (ref 6–20)
CO2: 23 mmol/L (ref 22–32)
Calcium: 9.4 mg/dL (ref 8.9–10.3)
Chloride: 105 mmol/L (ref 98–111)
Creatinine, Ser: 0.76 mg/dL (ref 0.44–1.00)
GFR calc Af Amer: >60 mL/min (ref >60)
GFR calc non Af Amer: >60 mL/min (ref >60)
Glucose, Bld: 123 mg/dL — ABNORMAL HIGH (ref 70–99)
Potassium: 3.3 mmol/L — ABNORMAL LOW (ref 3.5–5.1)
Sodium: 138 mmol/L (ref 135–145)
Total Bilirubin: 1 mg/dL (ref 0.3–1.2)
Total Protein: 7.5 g/dL (ref 6.5–8.1)

## 2020-04-07 LAB — RAPID URINE DRUG SCREEN, HOSP PERFORMED
Amphetamines: NOT DETECTED
Barbiturates: NOT DETECTED
Benzodiazepines: NOT DETECTED
Cocaine: NOT DETECTED
Opiates: NOT DETECTED
Tetrahydrocannabinol: POSITIVE — AB

## 2020-04-07 LAB — ETHANOL: Alcohol, Ethyl (B): <10 mg/dL (ref <10)

## 2020-04-07 LAB — I-STAT BETA HCG BLOOD, ED (MC, WL, AP ONLY): I-stat hCG, quantitative: <5 m[IU]/mL (ref <5)

## 2020-04-07 LAB — ACETAMINOPHEN LEVEL: Acetaminophen (Tylenol), Serum: <10 ug/mL — ABNORMAL LOW (ref 10–30)

## 2020-04-07 LAB — SALICYLATE LEVEL: Salicylate Lvl: <7 mg/dL — ABNORMAL LOW (ref 7.0–30.0)

## 2020-04-07 MED ORDER — ZOLPIDEM TARTRATE 5 MG PO TABS
5.0000 mg | ORAL_TABLET | Freq: Every evening | ORAL | Status: DC | PRN
  Administered 2020-04-08: 5 mg via ORAL
  Filled 2020-04-07: qty 1

## 2020-04-07 MED ORDER — LORAZEPAM 0.5 MG PO TABS
0.5000 mg | ORAL_TABLET | Freq: Once | ORAL | Status: AC
  Administered 2020-04-07: 0.5 mg via ORAL
  Filled 2020-04-07: qty 1

## 2020-04-07 MED ORDER — TETANUS-DIPHTH-ACELL PERTUSSIS 5-2.5-18.5 LF-MCG/0.5 IM SUSP
0.5000 mL | Freq: Once | INTRAMUSCULAR | Status: DC
  Filled 2020-04-07: qty 0.5

## 2020-04-07 MED ORDER — POTASSIUM CHLORIDE CRYS ER 20 MEQ PO TBCR
40.0000 meq | EXTENDED_RELEASE_TABLET | Freq: Once | ORAL | Status: AC
  Administered 2020-04-07: 23:00:00 40 meq via ORAL
  Filled 2020-04-07: qty 2

## 2020-04-07 MED ORDER — ACETAMINOPHEN 325 MG PO TABS: 650.0000 mg | ORAL_TABLET | ORAL | Status: DC | PRN

## 2020-04-07 NOTE — ED Triage Notes (Signed)
Patient arrived with complaints of feeling suicidal after a break up today. Patient states she started to cut herself on the leg, small superficial mark to right thigh. Reports plan would be to hang herself. Denies any alcohol or drug use today other than marijuana.

## 2020-04-07 NOTE — ED Notes (Signed)
Pt has one bag of clothes that has been placed under the nurse's station in triage.

## 2020-04-07 NOTE — ED Notes (Signed)
TTS cart at bedside. 

## 2020-04-07 NOTE — ED Provider Notes (Signed)
[facility]-EMERGENCY DEPT Provider Note   CSN: [id number] Arrival date & time: 04/07/20  2120     History Chief Complaint  Patient presents with  . Suicidal    [name] is a 24 y.o. female with history of anxiety and depression who presents to the emergency department for evaluation of suicidal thoughts today. Patient states she has had increasing depression recently and today when her girlfriend broke up with her this significantly worsened leading to thoughts of suicide. She states she had plan to hang herself, she had a rope and went to the woods but did not follow through on plan. She did cut herself some on the R inner thigh. No other alleviating/aggravating factors.  Denies homicidal ideations or hallucinations.  She does utilize marijuana, no other drug use.  Denies alcohol use today.  HPI     Past Medical History:  Diagnosis Date  . Anxiety   . Depression   . Medical history non-contributory     Patient Active Problem List   Diagnosis Date Noted  . Major depressive disorder, recurrent severe without psychotic features (HCC) 03/25/2015    Past Surgical History:  Procedure Laterality Date  . TYMPANOSTOMY TUBE PLACEMENT       OB History   No obstetric history on file.     No family history on file.  Social History   Tobacco Use  . Smoking status: Never Smoker  . Smokeless tobacco: Never Used  Substance Use Topics  . Alcohol use: No  . Drug use: No    Home Medications Prior to Admission medications   Medication Sig Start Date End Date Taking? Authorizing Provider  citalopram (CELEXA) 10 MG tablet Take 1 tablet (10 mg total) by mouth daily. 03/28/15   Adonis Brook, NP  dexamethasone (DECADRON) 4 MG tablet Take 2 tablets (8 mg total) by mouth daily. 12/01/18   Raeford Razor, MD  pantoprazole (PROTONIX) 20 MG tablet Take 1 tablet (20 mg total) by mouth daily. 12/01/18   Raeford Razor, MD  traZODone (DESYREL) 50 MG tablet Take  1 tablet (50 mg total) by mouth at bedtime as needed for sleep. 03/28/15   Adonis Brook, NP    Allergies    Patient has no known allergies.  Review of Systems   Review of Systems  Constitutional: Negative for chills and fever.  Respiratory: Negative for shortness of breath.   Cardiovascular: Negative for chest pain.  Gastrointestinal: Negative for abdominal pain and vomiting.  Skin: Positive for wound.  Neurological: Negative for syncope.  Psychiatric/Behavioral: Positive for self-injury and suicidal ideas. Negative for hallucinations. The patient is nervous/anxious.   All other systems reviewed and are negative.   Physical Exam Updated Vital Signs BP (!) 157/70 (BP Location: Left Arm)   Pulse (!) 129   Temp 98.3 F (36.8 C) (Oral)   Resp 20   SpO2 100%   Physical Exam Vitals and nursing note reviewed.  Constitutional:      General: She is not in acute distress.    Appearance: She is well-developed. She is not toxic-appearing.  HENT:     Head: Normocephalic and atraumatic.  Eyes:     General:        Right eye: No discharge.        Left eye: No discharge.     Conjunctiva/sclera: Conjunctivae normal.  Cardiovascular:     Rate and Rhythm: Regular rhythm. Tachycardia present.  Pulmonary:     Effort: Pulmonary effort is normal. No  respiratory distress.     Breath sounds: Normal breath sounds. No wheezing, rhonchi or rales.  Abdominal:     General: There is no distension.     Palpations: Abdomen is soft.     Tenderness: There is no abdominal tenderness.  Musculoskeletal:     Cervical back: Neck supple.     Comments: Small superficial abrasion to the right inner thigh, no active bleeding, no appreciable foreign bodies.  Skin:    General: Skin is warm and dry.     Findings: No rash.  Neurological:     Mental Status: She is alert.     Comments: Clear speech.   Psychiatric:        Mood and Affect: Mood is anxious.        Thought Content: Thought content includes  suicidal ideation. Thought content does not include homicidal ideation. Thought content includes suicidal plan. Thought content does not include homicidal plan.     Comments: Intermittently tearful throughout assessment.    ED Results / Procedures / Treatments   Labs (all labs ordered are listed, but only abnormal results are displayed) Labs Reviewed  COMPREHENSIVE METABOLIC PANEL - Abnormal; Notable for the following components:      Result Value   Potassium 3.3 (*)    Glucose, Bld 123 (*)    All other components within normal limits  SALICYLATE LEVEL - Abnormal; Notable for the following components:   Salicylate Lvl <1.6 (*)    All other components within normal limits  ACETAMINOPHEN LEVEL - Abnormal; Notable for the following components:   Acetaminophen (Tylenol), Serum <10 (*)    All other components within normal limits  CBC - Abnormal; Notable for the following components:   WBC 15.0 (*)    All other components within normal limits  RAPID URINE DRUG SCREEN, HOSP PERFORMED - Abnormal; Notable for the following components:   Tetrahydrocannabinol POSITIVE (*)    All other components within normal limits  RESPIRATORY PANEL BY RT PCR (FLU A&B, COVID)  ETHANOL  I-STAT BETA HCG BLOOD, ED ([facility], [facility], [facility] ONLY)    EKG None  Radiology No results found.  Procedures Procedures (including critical care time)  Medications Ordered in ED Medications - No data to display  ED Course  I have reviewed the triage vital signs and the nursing notes.  Pertinent labs & imaging results that were available during my care of the patient were reviewed by me and considered in my medical decision making (see chart for details).    [name] was evaluated in Emergency Department on 04/07/2020 for the symptoms described in the history of present illness. He/she was evaluated in the context of the global COVID-19 pandemic, which necessitated consideration that the patient might be at risk for  infection with the SARS-CoV-2 virus that causes COVID-19. Institutional protocols and algorithms that pertain to the evaluation of patients at risk for COVID-19 are in a state of rapid change based on information released by regulatory bodies including the CDC and federal and state organizations. These policies and algorithms were followed during the patient's care in the ED.  MDM Rules/Calculators/A&P                      Patient presents to the emergency department for suicidal ideations with plan.  She is nontoxic, appears very upset, mildly tachycardic on initial assessment, BP somewhat elevated, doubt HTN emergency.  She has a very mild area of superficial abrasion to the right  inner thigh which does not appear to require sutures, staples, or skin adhesive, unknown last tetanus therefore this was ordered.  Screening labs with leukocytosis felt to be nonspecific.  Mild hypokalemia, will orally replaced.  UDS positive for tetrahydrocannabinol, toxicology screens otherwise negative.  Given ativan for anxiety- patient in agreement with this. Heart rate and blood pressure improved with repeat vital signs.  Vitals:   04/07/20 2136 04/07/20 2241  BP: (!) 157/70 137/77  Pulse: (!) 129 97  Resp: 20 20  Temp: 98.3 F (36.8 C)   SpO2: 100% 100%    Patient is medically cleared for TTS assessment.  Disposition per [facility].  Home meds not ordered as still pending pharmacy tech med rec.   Final Clinical Impression(s) / ED Diagnoses Final diagnoses:  Suicidal ideation    Rx / DC Orders ED Discharge Orders    None       Cherly Anderson, PA-C 04/07/20 2355    Charlynne Pander, MD 04/11/20 1616

## 2020-04-08 ENCOUNTER — Encounter (HOSPITAL_COMMUNITY): Payer: Self-pay | Admitting: Behavioral Health

## 2020-04-08 ENCOUNTER — Other Ambulatory Visit: Payer: Self-pay

## 2020-04-08 ENCOUNTER — Inpatient Hospital Stay (HOSPITAL_COMMUNITY)
Admission: AD | Admit: 2020-04-08 | Discharge: 2020-04-11 | DRG: 885 | Disposition: A | Discharge status: Home / Self Care | Payer: Federal, State, Local not specified - Other | Source: Intra-hospital | Attending: Psychiatry | Admitting: Psychiatry

## 2020-04-08 DIAGNOSIS — F322 Major depressive disorder, single episode, severe without psychotic features: Secondary | ICD-10-CM | POA: Diagnosis present

## 2020-04-08 DIAGNOSIS — Z915 Personal history of self-harm: Secondary | ICD-10-CM

## 2020-04-08 DIAGNOSIS — R45851 Suicidal ideations: Secondary | ICD-10-CM | POA: Diagnosis present

## 2020-04-08 DIAGNOSIS — F607 Dependent personality disorder: Secondary | ICD-10-CM | POA: Diagnosis present

## 2020-04-08 DIAGNOSIS — F1220 Cannabis dependence, uncomplicated: Secondary | ICD-10-CM | POA: Diagnosis present

## 2020-04-08 DIAGNOSIS — F332 Major depressive disorder, recurrent severe without psychotic features: Principal | ICD-10-CM | POA: Diagnosis present

## 2020-04-08 DIAGNOSIS — F339 Major depressive disorder, recurrent, unspecified: Secondary | ICD-10-CM | POA: Diagnosis present

## 2020-04-08 DIAGNOSIS — G4700 Insomnia, unspecified: Secondary | ICD-10-CM | POA: Diagnosis present

## 2020-04-08 DIAGNOSIS — F410 Panic disorder [episodic paroxysmal anxiety] without agoraphobia: Secondary | ICD-10-CM | POA: Diagnosis present

## 2020-04-08 LAB — TSH: TSH: 1.864 u[IU]/mL (ref 0.350–4.500)

## 2020-04-08 LAB — RESPIRATORY PANEL BY RT PCR (FLU A&B, COVID)
Influenza A by PCR: NEGATIVE
Influenza B by PCR: NEGATIVE
SARS Coronavirus 2 by RT PCR: NEGATIVE

## 2020-04-08 MED ORDER — LORAZEPAM 1 MG PO TABS
1.0000 mg | ORAL_TABLET | ORAL | Status: AC
  Administered 2020-04-08: 1 mg via ORAL
  Filled 2020-04-08: qty 1

## 2020-04-08 MED ORDER — HYDROXYZINE HCL 25 MG PO TABS
25.0000 mg | ORAL_TABLET | Freq: Four times a day (QID) | ORAL | Status: DC | PRN
  Administered 2020-04-08: 25 mg via ORAL
  Filled 2020-04-08: qty 1

## 2020-04-08 MED ORDER — MAGNESIUM HYDROXIDE 400 MG/5ML PO SUSP: 30.0000 mL | Freq: Every day | ORAL | Status: DC | PRN

## 2020-04-08 MED ORDER — ALUM & MAG HYDROXIDE-SIMETH 200-200-20 MG/5ML PO SUSP: 30.0000 mL | ORAL | Status: DC | PRN

## 2020-04-08 MED ORDER — ENSURE ENLIVE PO LIQD
237.0000 mL | Freq: Two times a day (BID) | ORAL | Status: DC
  Administered 2020-04-08 – 2020-04-10 (×4): 237 mL via ORAL

## 2020-04-08 MED ORDER — ACETAMINOPHEN 325 MG PO TABS
650.0000 mg | ORAL_TABLET | Freq: Four times a day (QID) | ORAL | Status: DC | PRN
  Filled 2020-04-08: qty 2

## 2020-04-08 MED ORDER — LORAZEPAM 0.5 MG PO TABS
0.5000 mg | ORAL_TABLET | Freq: Four times a day (QID) | ORAL | Status: DC | PRN
  Administered 2020-04-08 – 2020-04-10 (×3): 0.5 mg via ORAL
  Filled 2020-04-08 (×3): qty 1

## 2020-04-08 MED ORDER — TRAZODONE HCL 50 MG PO TABS
50.0000 mg | ORAL_TABLET | Freq: Every evening | ORAL | Status: DC | PRN
  Administered 2020-04-08 – 2020-04-10 (×3): 50 mg via ORAL
  Filled 2020-04-08: qty 1
  Filled 2020-04-08: qty 7
  Filled 2020-04-08 (×2): qty 1

## 2020-04-08 MED ORDER — VENLAFAXINE HCL ER 37.5 MG PO CP24
37.5000 mg | ORAL_CAPSULE | Freq: Every day | ORAL | Status: DC
  Administered 2020-04-08 – 2020-04-09 (×2): 37.5 mg via ORAL
  Filled 2020-04-08 (×4): qty 1

## 2020-04-08 NOTE — Progress Notes (Signed)
Admission note::  Pt is a 24 year old Caucasian female admitted to the services of Dr. Jola Babinski for depression, anxiety, and suicidal ideation.  Pt's girlfriend broke up with her yesterday and is moving out, and also May will mark the fifth anniversary of her mother's death.  Pt is feeling anxious and overwhelmed.  Pt has been experiencing increased depression, panic attacks, and suicidal ideation.  Pt states she had cut her inner thigh, but only a slight red mark was visible, skin not penetrated.  Pt is cooperative but tearful.

## 2020-04-08 NOTE — Progress Notes (Signed)
   04/08/20 0551  Psych Admission Type (Psych Patients Only)  Admission Status Voluntary  Psychosocial Assessment  Patient Complaints Anxiety;Crying spells;Depression  Eye Contact Fair  Facial Expression Sad;Trembling lip  Affect Anxious;Sad;Depressed  Speech Soft;Logical/coherent  Interaction Cautious;Minimal  Motor Activity Other (Comment) (WDL)  Appearance/Hygiene In scrubs  Behavior Characteristics Appropriate to situation  Mood Anxious;Depressed;Sad  Thought Process  Coherency WDL  Content WDL  Delusions None reported or observed  Perception WDL  Hallucination None reported or observed  Judgment Impaired  Confusion None  Danger to Self  Current suicidal ideation? Denies  Danger to Others  Danger to Others None reported or observed

## 2020-04-08 NOTE — Progress Notes (Signed)
   04/08/20 2313  Psych Admission Type (Psych Patients Only)  Admission Status Voluntary  Psychosocial Assessment  Patient Complaints Anxiety;Crying spells  Eye Contact Fair  Facial Expression Sad;Trembling lip  Affect Anxious;Sad;Depressed  Speech Soft;Logical/coherent  Interaction Cautious;Minimal  Motor Activity Other (Comment) (WDL)  Appearance/Hygiene In scrubs  Behavior Characteristics Anxious  Mood Anxious;Depressed  Thought Process  Coherency WDL  Content WDL  Delusions None reported or observed  Perception WDL  Hallucination None reported or observed  Judgment Impaired  Confusion None  Danger to Self  Current suicidal ideation? Denies  Danger to Others  Danger to Others None reported or observed  D: Patient anxious, crying about girlfriend moving out before she discharges.  A: Medications administered as prescribed. Support and encouragement provided as needed.  R: Patient remains safe on the unit. Will continue to monitor for safety and stability.

## 2020-04-08 NOTE — ED Notes (Signed)
Safe transport called to take patient to [facility]. 

## 2020-04-08 NOTE — Progress Notes (Signed)
Dar Note: Patient presents with anxious affect and depressed mood.  Patient has been very tearful all morning.  Ativan 0.5 mg given with fair effect.  Forward little information during assessment.  Denies SI, auditory and visual hallucinations.  Routine safety checks maintained every 15 minutes.  Support and encouragement offered as needed.  Patient is safe on and off the unit.

## 2020-04-08 NOTE — Progress Notes (Signed)
NUTRITION ASSESSMENT RD working remotely.  Pt identified as at risk on the Malnutrition Screen Tool  INTERVENTION: - continue Ensure Enlive BID, each supplement provides 350 kcal and 20 grams of protein.  NUTRITION DIAGNOSIS: Unintentional weight loss related to sub-optimal intake as evidenced by pt report.   Goal: Pt to meet >/= 90% of their estimated nutrition needs.  Monitor:  PO intake  Assessment:  Patient admitted for increased depression, anxiety, panic attacks, and SI. Her and her girlfriend the day PTA and girlfriend is moving out. Next month (May) marks 5 years since the passing of patient's mom.   Per chart review, weight today recorded as 158 lb. PTA, the most recently documented weight was on 12/01/18 when she weighed 162 lb.   Ensure Enlive was ordered BID per [facility]NS protocol to start this AM.   24 y.o. female  Height: Ht Readings from Last 1 Encounters:  04/08/20 5\' 3"  (1.6 m)    Weight: Wt Readings from Last 1 Encounters:  04/08/20 71.7 kg    Weight Hx: Wt Readings from Last 10 Encounters:  04/08/20 71.7 kg  12/01/18 73.9 kg  03/24/15 63 kg (72 %, Z= 0.58)*   * Growth percentiles are based on CDC (Girls, 2-20 Years) data.    BMI:  Body mass index is 27.99 kg/m. Pt meets criteria for overweight status based on current BMI.  Estimated Nutritional Needs: Kcal: 25-30 kcal/kg Protein: > 1 gram protein/kg Fluid: 1 ml/kcal  Diet Order:  Diet Order            Diet regular Room service appropriate? Yes; Fluid consistency: Thin  Diet effective now             Pt is also offered choice of unit snacks mid-morning and mid-afternoon.  Pt is eating as desired.   Lab results and medications reviewed.     03/26/15, MS, RD, LDN, CNSC Inpatient Clinical Dietitian RD pager # available in [facility]  After hours/weekend pager # available in [facility]

## 2020-04-08 NOTE — [facility] Suicide Risk Assessment (Signed)
[facility] Admission Suicide Risk Assessment   Nursing information obtained from:  Patient Demographic factors:  Adolescent or young adult, Caucasian, Gay, lesbian, or bisexual orientation Current Mental Status:  Suicidal ideation indicated by patient, Suicidal ideation indicated by others, Suicide plan, Self-harm thoughts, Self-harm behaviors Loss Factors:  Loss of significant relationship Historical Factors:  Impulsivity, Anniversary of important loss Risk Reduction Factors:  Living with another person, especially a relative, Sense of responsibility to family  Total Time spent with patient: 30 minutes Principal Problem: Major depressive disorder, recurrent severe without psychotic features (HCC) Diagnosis:  Principal Problem:   Major depressive disorder, recurrent severe without psychotic features (HCC) Active Problems:   Cannabis use disorder, severe, dependence (HCC)   MDD (major depressive disorder), severe (HCC)  Subjective Data: Patient is seen and examined.  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  The patient stated that she had been having relationship problems over the last couple of weeks.  She stated that her girlfriend had decided to break up with her on the date of admission.  She stated that she has been having problems with her girlfriend over the last 2 weeks.  The patient admitted to being very dependent upon her, and always feeling as though they needed to be with someone.  The girlfriend had been living with the patient as well as her father for the last year.  The patient admitted that she had had a previous psychiatric hospitalization at our facility approximately 5 years ago.  She stated that she took the Celexa medicine for a while  after discharge, but then had been doing better and then quit taking it and did not follow-up afterwards.  She also stated that she is under stress secondary to the anniversary of the death of her mother in 18-May-2023.  She stated that when she was in the hospital 5 years ago her mother was well, but shortly after discharge her mother was diagnosed with cancer, and then passed away thereafter.  The patient admitted to a history of suicidal ideation.  She stated that when she was younger she cut her self as well as burned herself, and recently had started cutting again.  She stated that she had a superficial cut on her right anterior thigh.  During interview the patient was quite tearful and almost in a panic issue.  She was given 1 mg of Ativan immediately to decrease her stress issues.  She denied any additional psychiatric hospitalizations after her admission 5 years ago at our facility.  Her discharge medications at that time included citalopram 10 mg p.o. daily and trazodone 50 mg p.o. nightly as needed.  She was admitted to our facility for evaluation and stabilization.  Continued Clinical Symptoms:  Alcohol Use Disorder Identification Test Final Score (AUDIT): 0 The "Alcohol Use Disorders Identification Test", Guidelines for Use in Primary Care, Second Edition.  World Science writer [facility]). Score between 0-7:  no or low risk or alcohol related problems. Score between 8-15:  moderate risk of alcohol related problems. Score between 16-19:  high risk of alcohol related problems. Score 20 or above:  warrants further diagnostic evaluation for alcohol dependence and treatment.   CLINICAL FACTORS:   Depression:   Anhedonia Hopelessness Impulsivity Insomnia Personality Disorders:   Cluster B More than one psychiatric diagnosis Previous Psychiatric  Diagnoses and Treatments   Musculoskeletal: Strength & Muscle Tone: within normal limits Gait & Station: normal Patient leans: N/A  Psychiatric  Specialty Exam: Physical Exam  Nursing note and vitals reviewed. Constitutional: She is oriented to person, place, and time. She appears well-developed and well-nourished.  HENT:  Head: Normocephalic and atraumatic.  Respiratory: Effort normal.  Neurological: She is alert and oriented to person, place, and time.    Review of Systems  Blood pressure 128/76, pulse 91, temperature 97.6 F (36.4 C), temperature source Oral, resp. rate 20, height 5\' 3"  (1.6 m), weight 71.7 kg, last menstrual period 03/08/2020, SpO2 100 %.Body mass index is 27.99 kg/m.  General Appearance: Disheveled  Eye Contact:  Fair  Speech:  Normal Rate  Volume:  Increased  Mood:  Anxious, Depressed and Dysphoric  Affect:  Congruent  Thought Process:  Coherent and Descriptions of Associations: Intact  Orientation:  Full (Time, Place, and Person)  Thought Content:  Logical and Rumination  Suicidal Thoughts:  Yes.  without intent/plan  Homicidal Thoughts:  No  Memory:  Immediate;   Good Recent;   Good Remote;   Good  Judgement:  Impaired  Insight:  Fair  Psychomotor Activity:  Increased  Concentration:  Concentration: Fair and Attention Span: Fair  Recall:  Good  Fund of Knowledge:  Good  Language:  Good  Akathisia:  Negative  Handed:  Right  AIMS (if indicated):     Assets:  Desire for Improvement Financial Resources/Insurance Housing Resilience  ADL's:  Intact  Cognition:  WNL  Sleep:         COGNITIVE FEATURES THAT CONTRIBUTE TO RISK:  None    SUICIDE RISK:   Moderate:  Frequent suicidal ideation with limited intensity, and duration, some specificity in terms of plans, no associated intent, good self-control, limited dysphoria/symptomatology, some risk factors present, and identifiable protective factors, including available and accessible social support.  PLAN OF CARE: Patient is seen and examined.  Patient is a 24 year old female with the above-stated past psychiatric history who is admitted  with worsening depression and suicidal ideation.  She will be admitted to the hospital.  She will be integrated into the milieu.  She will be encouraged to attend groups.  She does not really state whether or not the Celexa was effective, but she had stopped that and she was not sure if she was well or not well at that time.  We will switch her to venlafaxine extended release.  We will start 37.5 mg p.o. daily and titrate that during the course the hospitalization.  She will also be prescribed lorazepam 0.5 mg p.o. every 6 hours as needed anxiety or panic symptoms.  She will most likely require some cognitive behavioral therapy to help her deal with her impulses as well as her dependent nature.  Review of her laboratories on admission revealed a mildly low potassium at 3.3, but otherwise normal electrolytes.  Her white blood cell count on admission was mildly elevated at 15.  Acetaminophen and salicylate were both negative.  Her beta-hCG was negative.  Blood alcohol was less than 10.  Her drug screen was positive for marijuana.  We do not have a urinalysis on her currently, but that will be ordered.  We will also get collateral information from her father with regard to the timeframe of a lot of her symptoms.  I certify that inpatient services furnished can reasonably be expected to improve the patient's condition.   Sharma Covert, MD 04/08/2020, 9:23 AM

## 2020-04-08 NOTE — [facility] Group Notes (Signed)
LCSW Group Therapy Note 04/08/2020 2:12 PM  Type of Therapy and Topic: Group Therapy: Overcoming Obstacles  Participation Level: Active  Description of Group:  In this group patients will be encouraged to explore what they see as obstacles to their own wellness and recovery. They will be guided to discuss their thoughts, feelings, and behaviors related to these obstacles. The group will process together ways to cope with barriers, with attention given to specific choices patients can make. Each patient will be challenged to identify changes they are motivated to make in order to overcome their obstacles. This group will be process-oriented, with patients participating in exploration of their own experiences as well as giving and receiving support and challenge from other group members.  Therapeutic Goals: 1. Patient will identify personal and current obstacles as they relate to admission. 2. Patient will identify barriers that currently interfere with their wellness or overcoming obstacles.  3. Patient will identify feelings, thought process and behaviors related to these barriers. 4. Patient will identify two changes they are willing to make to overcome these obstacles:   Summary of Patient Progress  Aliena was engaged and participated throughout the group session. Frenchie reports her main obstacle is her anxiety regarding returning home. She shared that she worries her newly learned copings skills and techniques "will not transfer" to my everyday life once she discharges. Fareedah reports the stigma of mental health prevents her from being forthcoming with her symptoms. Lelania reports she plans to educate her supports moving forward to improve her support network.    Therapeutic Modalities:  Cognitive Behavioral Therapy Solution Focused Therapy Motivational Interviewing Relapse Prevention Therapy   Alcario Drought Clinical Social Worker

## 2020-04-08 NOTE — Progress Notes (Signed)
Recreation Therapy Notes  Date:  4.12.21 Time: 0930 Location: [address] Dayroom  Group Topic: Stress Management  Goal Area(s) Addresses:  Patient will identify positive stress management techniques. Patient will identify benefits of using stress management post d/c.  Behavioral Response:  Engaged  Intervention: Stress Management  Activity : Meditation.  LRT played a meditation that focused on looking at humanity as each individual working together to make the whole better.  Patients were to listen and follow along as meditation played.    Education:  Stress Management, Discharge Planning.   Education Outcome: Acknowledges Education  Clinical Observations/Feedback: Pt attended and participated in activity.     Ayson Cherubini, LRT/CTRS         Jaceion Aday A 04/08/2020 11:10 AM 

## 2020-04-08 NOTE — H&P (Signed)
Psychiatric Admission Assessment Adult  Patient Identification: [name] MRN:  [id number] Date of Evaluation:  04/08/2020 Chief Complaint:  MDD (major depressive disorder), severe (HCC) [F32.2] Principal Diagnosis: Major depressive disorder, recurrent severe without psychotic features (HCC) Diagnosis:  Principal Problem:   Major depressive disorder, recurrent severe without psychotic features (HCC) Active Problems:   Cannabis use disorder, severe, dependence (HCC)   MDD (major depressive disorder), severe (HCC)  History of Present Illness: Patient is seen and examined.  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  The patient stated that she had been having relationship problems over the last couple of weeks.  She stated that her girlfriend had decided to break up with her on the date of admission.  She stated that she has been having problems with her girlfriend over the last 2 weeks.  The patient admitted to being very dependent upon her, and always feeling as though they needed to be with someone.  The girlfriend had been living with the patient as well as her father for the last year.  The patient admitted that she had had a previous psychiatric hospitalization at our facility approximately 5 years ago.  She stated that she took the Celexa medicine for a while after discharge, but then had been doing better and then quit taking it and did not follow-up afterwards.  She also stated that she is under stress secondary to the anniversary of the death of her mother in May.  She stated that when she was in the hospital 5 years ago her mother was well, but shortly after discharge her mother was diagnosed with cancer, and then passed away thereafter.  The  patient admitted to a history of suicidal ideation.  She stated that when she was younger she cut her self as well as burned herself, and recently had started cutting again.  She stated that she had a superficial cut on her right anterior thigh.  During interview the patient was quite tearful and almost in a panic issue.  She was given 1 mg of Ativan immediately to decrease her stress issues.  She denied any additional psychiatric hospitalizations after her admission 5 years ago at our facility.  Her discharge medications at that time included citalopram 10 mg p.o. daily and trazodone 50 mg p.o. nightly as needed.  She was admitted to our facility for evaluation and stabilization.  Associated Signs/Symptoms: Depression Symptoms:  depressed mood, anhedonia, insomnia, psychomotor agitation, fatigue, feelings of worthlessness/guilt, difficulty concentrating, hopelessness, suicidal thoughts without plan, anxiety, loss of energy/fatigue, disturbed sleep, (Hypo) Manic Symptoms:  Impulsivity, Labiality of Mood, Anxiety Symptoms:  Excessive Worry, Psychotic Symptoms:  Denied PTSD Symptoms: Negative Total Time spent with patient: 45 minutes  Past Psychiatric History: Patient has had 1 previous psychiatric hospitalization at our facility in 2016.  At that time she reported symptoms of depression since she had been in eighth grade.  She was admitted with depression, suicidal ideation.  Is the patient at risk to self? Yes.    Has the patient been a risk to self in the past 6 months? No.  Has the patient been a risk to self within the distant past? Yes.    Is the patient a risk to others? No.  Has the patient been a risk to others in the past 6  months? No.  Has the patient been a risk to others within the distant past? No.   Prior Inpatient Therapy:   Prior Outpatient Therapy:    Alcohol Screening: 1. How often do you have a drink containing alcohol?: Never 2. How many drinks containing  alcohol do you have on a typical day when you are drinking?: 1 or 2 3. How often do you have six or more drinks on one occasion?: Never AUDIT-C Score: 0 4. How often during the last year have you found that you were not able to stop drinking once you had started?: Never 5. How often during the last year have you failed to do what was normally expected from you becasue of drinking?: Never 6. How often during the last year have you needed a first drink in the morning to get yourself going after a heavy drinking session?: Never 7. How often during the last year have you had a feeling of guilt of remorse after drinking?: Never 8. How often during the last year have you been unable to remember what happened the night before because you had been drinking?: Never 9. Have you or someone else been injured as a result of your drinking?: No 10. Has a relative or friend or a doctor or another health worker been concerned about your drinking or suggested you cut down?: No Alcohol Use Disorder Identification Test Final Score (AUDIT): 0 Alcohol Brief Interventions/Follow-up: AUDIT Score <7 follow-up not indicated Substance Abuse History in the last 12 months:  Yes.   Consequences of Substance Abuse: Negative Previous Psychotropic Medications: Yes  Psychological Evaluations: No  Past Medical History:  Past Medical History:  Diagnosis Date  . Anxiety   . Depression   . Medical history non-contributory     Past Surgical History:  Procedure Laterality Date  . TYMPANOSTOMY TUBE PLACEMENT     Family History: History reviewed. No pertinent family history. Family Psychiatric  History: Noncontributory Tobacco Screening: Have you used any form of tobacco in the last 30 days? (Cigarettes, Smokeless Tobacco, Cigars, and/or Pipes): No Social History:  Social History   Substance and Sexual Activity  Alcohol Use No     Social History   Substance and Sexual Activity  Drug Use No    Additional Social  History:                           Allergies:  No Known Allergies Lab Results:  Results for orders placed or performed during the hospital encounter of 04/07/20 (from the past 48 hour(s))  Rapid urine drug screen (hospital performed)     Status: Abnormal   Collection Time: 04/07/20  9:54 PM  Result Value Ref Range   Opiates NONE DETECTED NONE DETECTED   Cocaine NONE DETECTED NONE DETECTED   Benzodiazepines NONE DETECTED NONE DETECTED   Amphetamines NONE DETECTED NONE DETECTED   Tetrahydrocannabinol POSITIVE (A) NONE DETECTED   Barbiturates NONE DETECTED NONE DETECTED    Comment: (NOTE) DRUG SCREEN FOR MEDICAL PURPOSES ONLY.  IF CONFIRMATION IS NEEDED FOR ANY PURPOSE, NOTIFY LAB WITHIN 5 DAYS. LOWEST DETECTABLE LIMITS FOR URINE DRUG SCREEN Drug Class                     Cutoff (ng/mL) Amphetamine and metabolites    1000 Barbiturate and metabolites    200 Benzodiazepine                 200 Tricyclics and  metabolites     300 Opiates and metabolites        300 Cocaine and metabolites        300 THC                            50 Performed at [facility], [address], [place], [place] [address]   Comprehensive metabolic panel     Status: Abnormal   Collection Time: 04/07/20  9:59 PM  Result Value Ref Range   Sodium 138 135 - 145 mmol/L   Potassium 3.3 (L) 3.5 - 5.1 mmol/L   Chloride 105 98 - 111 mmol/L   CO2 23 22 - 32 mmol/L   Glucose, Bld 123 (H) 70 - 99 mg/dL    Comment: Glucose reference range applies only to samples taken after fasting for at least 8 hours.   BUN 13 6 - 20 mg/dL   Creatinine, Ser 6.04 0.44 - 1.00 mg/dL   Calcium 9.4 8.9 - 54.0 mg/dL   Total Protein 7.5 6.5 - 8.1 g/dL   Albumin 4.3 3.5 - 5.0 g/dL   AST 16 15 - 41 U/L   ALT 16 0 - 44 U/L   Alkaline Phosphatase 39 38 - 126 U/L   Total Bilirubin 1.0 0.3 - 1.2 mg/dL   GFR calc non Af Amer >60 >60 mL/min   GFR calc Af Amer >60 >60 mL/min   Anion gap 10 5 - 15     Comment: Performed at [facility], [address], [place], [place] [address]  Ethanol     Status: None   Collection Time: 04/07/20  9:59 PM  Result Value Ref Range   Alcohol, Ethyl (B) <10 <10 mg/dL    Comment: (NOTE) Lowest detectable limit for serum alcohol is 10 mg/dL. For medical purposes only. Performed at [facility], [address], [place], [place] [address]   Salicylate level     Status: Abnormal   Collection Time: 04/07/20  9:59 PM  Result Value Ref Range   Salicylate Lvl <7.0 (L) 7.0 - 30.0 mg/dL    Comment: Performed at [facility], [address], [place], [place] [address]  Acetaminophen level     Status: Abnormal   Collection Time: 04/07/20  9:59 PM  Result Value Ref Range   Acetaminophen (Tylenol), Serum <10 (L) 10 - 30 ug/mL    Comment: (NOTE) Therapeutic concentrations vary significantly. A range of 10-30 ug/mL  may be an effective concentration for many patients. However, some  are best treated at concentrations outside of this range. Acetaminophen concentrations >150 ug/mL at 4 hours after ingestion  and >50 ug/mL at 12 hours after ingestion are often associated with  toxic reactions. Performed at [facility], [address] [place], [place] [address]   cbc     Status: Abnormal   Collection Time: 04/07/20  9:59 PM  Result Value Ref Range   WBC 15.0 (H) 4.0 - 10.5 K/uL   RBC 4.68 3.87 - 5.11 MIL/uL   Hemoglobin 13.6 12.0 - 15.0 g/dL   HCT 78.4 69.6 - 29.5 %   MCV 84.4 80.0 - 100.0 fL   MCH 29.1 26.0 - 34.0 pg   MCHC 34.4 30.0 - 36.0 g/dL   RDW 28.4 13.2 - 44.0 %   Platelets 376 150 - 400 K/uL   nRBC 0.0 0.0 - 0.2 %    Comment:  Performed at [facility], [place] [address], [place], [place] [address]  I-Stat beta hCG blood, ED     Status: None   Collection Time: 04/07/20 10:04 PM  Result Value Ref Range   I-stat hCG, quantitative <5.0 <5 mIU/mL   Comment 3             Comment:   GEST. AGE      CONC.  (mIU/mL)   <=1 WEEK        5 - 50     2 WEEKS       50 - 500     3 WEEKS       100 - 10,000     4 WEEKS     1,000 - 30,000        FEMALE AND NON-PREGNANT FEMALE:     LESS THAN 5 mIU/mL   Respiratory Panel by RT PCR (Flu A&B, Covid) - Nasopharyngeal Swab     Status: None   Collection Time: 04/08/20  1:11 AM   Specimen: Nasopharyngeal Swab  Result Value Ref Range   SARS Coronavirus 2 by RT PCR NEGATIVE NEGATIVE    Comment: (NOTE) SARS-CoV-2 target nucleic acids are NOT DETECTED. The SARS-CoV-2 RNA is generally detectable in upper respiratoy specimens during the acute phase of infection. The lowest concentration of SARS-CoV-2 viral copies this assay can detect is 131 copies/mL. A negative result does not preclude SARS-Cov-2 infection and should not be used as the sole basis for treatment or other patient management decisions. A negative result may occur with  improper specimen collection/handling, submission of specimen other than nasopharyngeal swab, presence of viral mutation(s) within the areas targeted by this assay, and inadequate number of viral copies (<131 copies/mL). A negative result must be combined with clinical observations, patient history, and epidemiological information. The expected result is Negative. Fact Sheet for Patients:  PinkCheek.be Fact Sheet for Healthcare Providers:  GravelBags.it This test is not yet ap proved or cleared by the [place] FDA and  has been authorized for detection and/or diagnosis of SARS-CoV-2 by FDA under an Emergency Use Authorization (EUA). This EUA will remain  in effect (meaning this test can be used) for the duration of the COVID-19 declaration under Section 564(b)(1) of the Act, 21 U.S.C. section 360bbb-3(b)(1), unless the authorization is terminated or revoked sooner.    Influenza A by PCR NEGATIVE NEGATIVE   Influenza B by  PCR NEGATIVE NEGATIVE    Comment: (NOTE) The Xpert Xpress SARS-CoV-2/FLU/RSV assay is intended as an aid in  the diagnosis of influenza from Nasopharyngeal swab specimens and  should not be used as a sole basis for treatment. Nasal washings and  aspirates are unacceptable for Xpert Xpress SARS-CoV-2/FLU/RSV  testing. Fact Sheet for Patients: PinkCheek.be Fact Sheet for Healthcare Providers: GravelBags.it This test is not yet approved or cleared by the [place] FDA and  has been authorized for detection and/or diagnosis of SARS-CoV-2 by  FDA under an Emergency Use Authorization (EUA). This EUA will remain  in effect (meaning this test can be used) for the duration of the  Covid-19 declaration under Section 564(b)(1) of the Act, 21  U.S.C. section 360bbb-3(b)(1), unless the authorization is  terminated or revoked. Performed at [facility], [place] [address] [place], [place] [address]     Blood Alcohol level:  Lab Results  Component Value Date   [facility] <10 04/07/2020   ETH <5 75/64/3329    Metabolic Disorder Labs:  Lab Results  Component Value Date   HGBA1C 5.3 03/25/2015   MPG 105 03/25/2015   No results found for: PROLACTIN No results found for: CHOL, TRIG, HDL, CHOLHDL, VLDL, LDLCALC  Current Medications: Current Facility-Administered Medications  Medication Dose Route Frequency Provider Last Rate Last Admin  . acetaminophen (TYLENOL) tablet 650 mg  650 mg Oral Q6H PRN Gillermo Murdoch, NP      . alum & mag hydroxide-simeth (MAALOX/MYLANTA) 200-200-20 MG/5ML suspension 30 mL  30 mL Oral Q4H PRN Gillermo Murdoch, NP      . feeding supplement (ENSURE ENLIVE) (ENSURE ENLIVE) liquid 237 mL  237 mL Oral BID BM Antonieta Pert, MD      . hydrOXYzine (ATARAX/VISTARIL) tablet 25 mg  25 mg Oral Q6H PRN Gillermo Murdoch, NP      . LORazepam (ATIVAN) tablet 0.5 mg  0.5 mg Oral Q6H PRN Antonieta Pert, MD      . magnesium hydroxide (MILK OF MAGNESIA) suspension 30 mL  30 mL Oral Daily PRN Gillermo Murdoch, NP      . traZODone (DESYREL) tablet 50 mg  50 mg Oral QHS PRN Antonieta Pert, MD      . venlafaxine XR (EFFEXOR-XR) 24 hr capsule 37.5 mg  37.5 mg Oral Q breakfast Antonieta Pert, MD   37.5 mg at 04/08/20 0908   PTA Medications: Medications Prior to Admission  Medication Sig Dispense Refill Last Dose  . citalopram (CELEXA) 10 MG tablet Take 1 tablet (10 mg total) by mouth daily. (Patient not taking: Reported on 04/08/2020) 30 tablet 0   . dexamethasone (DECADRON) 4 MG tablet Take 2 tablets (8 mg total) by mouth daily. (Patient not taking: Reported on 04/08/2020) 4 tablet 0   . pantoprazole (PROTONIX) 20 MG tablet Take 1 tablet (20 mg total) by mouth daily. (Patient not taking: Reported on 04/08/2020) 30 tablet 0   . traZODone (DESYREL) 50 MG tablet Take 1 tablet (50 mg total) by mouth at bedtime as needed for sleep. (Patient not taking: Reported on 04/08/2020) 30 tablet 0     Musculoskeletal: Strength & Muscle Tone: within normal limits Gait & Station: normal Patient leans: N/A  Psychiatric Specialty Exam: Physical Exam  Nursing note and vitals reviewed. Constitutional: She is oriented to person, place, and time. She appears well-developed and well-nourished.  HENT:  Head: Normocephalic and atraumatic.  Respiratory: Effort normal.  Neurological: She is alert and oriented to person, place, and time.    Review of Systems  Blood pressure 128/76, pulse 91, temperature 97.6 F (36.4 C), temperature source Oral, resp. rate 20, height 5\' 3"  (1.6 m), weight 71.7 kg, last menstrual period 03/08/2020, SpO2 100 %.Body mass index is 27.99 kg/m.  General Appearance: Disheveled  Eye Contact:  Minimal  Speech:  Pressured  Volume:  Increased  Mood:  Anxious and Depressed  Affect:  Congruent  Thought Process:  Coherent and Descriptions of Associations: Intact   Orientation:  Full (Time, Place, and Person)  Thought Content:  Logical and Rumination  Suicidal Thoughts:  Yes.  without intent/plan  Homicidal Thoughts:  No  Memory:  Immediate;   Fair Recent;   Fair Remote;   Fair  Judgement:  Impaired  Insight:  Fair  Psychomotor Activity:  Increased  Concentration:  Concentration: Fair and Attention Span: Fair  Recall:  05/08/2020 of Knowledge:  Good  Language:  Good  Akathisia:  Negative  Handed:  Right  AIMS (if indicated):     Assets:  Communication Skills  Desire for Improvement Housing Resilience Social Support  ADL's:  Intact  Cognition:  WNL  Sleep:       Treatment Plan Summary: Daily contact with patient to assess and evaluate symptoms and progress in treatment, Medication management and Plan : Patient is seen and examined.  Patient is a 24 year old female with the above-stated past psychiatric history who is admitted with worsening depression and suicidal ideation.  She will be admitted to the hospital.  She will be integrated into the milieu.  She will be encouraged to attend groups.  She does not really state whether or not the Celexa was effective, but she had stopped that and she was not sure if she was well or not well at that time.  We will switch her to venlafaxine extended release.  We will start 37.5 mg p.o. daily and titrate that during the course the hospitalization.  She will also be prescribed lorazepam 0.5 mg p.o. every 6 hours as needed anxiety or panic symptoms.  She will most likely require some cognitive behavioral therapy to help her deal with her impulses as well as her dependent nature.  Review of her laboratories on admission revealed a mildly low potassium at 3.3, but otherwise normal electrolytes.  Her white blood cell count on admission was mildly elevated at 15.  Acetaminophen and salicylate were both negative.  Her beta-hCG was negative.  Blood alcohol was less than 10.  Her drug screen was positive for marijuana.   We do not have a urinalysis on her currently, but that will be ordered.  We will also get collateral information from her father with regard to the timeframe of a lot of her symptoms.  Observation Level/Precautions:  15 minute checks  Laboratory:  Chemistry Profile  Psychotherapy:    Medications:    Consultations:    Discharge Concerns:    Estimated LOS:  Other:     Physician Treatment Plan for Primary Diagnosis: Major depressive disorder, recurrent severe without psychotic features (HCC) Long Term Goal(s): Improvement in symptoms so as ready for discharge  Short Term Goals: Ability to identify changes in lifestyle to reduce recurrence of condition will improve, Ability to verbalize feelings will improve, Ability to disclose and discuss suicidal ideas, Ability to demonstrate self-control will improve, Ability to identify and develop effective coping behaviors will improve, Ability to maintain clinical measurements within normal limits will improve, Compliance with prescribed medications will improve and Ability to identify triggers associated with substance abuse/mental health issues will improve  Physician Treatment Plan for Secondary Diagnosis: Principal Problem:   Major depressive disorder, recurrent severe without psychotic features (HCC) Active Problems:   Cannabis use disorder, severe, dependence (HCC)   MDD (major depressive disorder), severe (HCC)  Long Term Goal(s): Improvement in symptoms so as ready for discharge  Short Term Goals: Ability to identify changes in lifestyle to reduce recurrence of condition will improve, Ability to verbalize feelings will improve, Ability to disclose and discuss suicidal ideas, Ability to demonstrate self-control will improve, Ability to identify and develop effective coping behaviors will improve, Ability to maintain clinical measurements within normal limits will improve, Compliance with prescribed medications will improve and Ability to identify  triggers associated with substance abuse/mental health issues will improve  I certify that inpatient services furnished can reasonably be expected to improve the patient's condition.    Antonieta Pert, MD 4/12/202110:32 AM

## 2020-04-08 NOTE — ED Notes (Signed)
Patient re-wanded by security again prior to transport

## 2020-04-08 NOTE — Tx Team (Signed)
Interdisciplinary Treatment and Diagnostic Plan Update  04/08/2020 Time of Session: 9:10am [name] MRN: [id number]  Principal Diagnosis: Major depressive disorder, recurrent severe without psychotic features ([place])  Secondary Diagnoses: Principal Problem:   Major depressive disorder, recurrent severe without psychotic features ([place]) Active Problems:   Cannabis use disorder, severe, dependence ([place])   MDD (major depressive disorder), severe ([place])   Current Medications:  Current Facility-Administered Medications  Medication Dose Route Frequency Provider Last Rate Last Admin  . acetaminophen (TYLENOL) tablet 650 mg  650 mg Oral Q6H PRN Caroline Sauger, NP      . alum & mag hydroxide-simeth (MAALOX/MYLANTA) 200-200-20 MG/5ML suspension 30 mL  30 mL Oral Q4H PRN Caroline Sauger, NP      . feeding supplement (ENSURE ENLIVE) (ENSURE ENLIVE) liquid 237 mL  237 mL Oral BID BM Sharma Covert, MD      . hydrOXYzine (ATARAX/VISTARIL) tablet 25 mg  25 mg Oral Q6H PRN Caroline Sauger, NP      . magnesium hydroxide (MILK OF MAGNESIA) suspension 30 mL  30 mL Oral Daily PRN Caroline Sauger, NP       PTA Medications: Medications Prior to Admission  Medication Sig Dispense Refill Last Dose  . citalopram (CELEXA) 10 MG tablet Take 1 tablet (10 mg total) by mouth daily. (Patient not taking: Reported on 04/08/2020) 30 tablet 0   . dexamethasone (DECADRON) 4 MG tablet Take 2 tablets (8 mg total) by mouth daily. (Patient not taking: Reported on 04/08/2020) 4 tablet 0   . pantoprazole (PROTONIX) 20 MG tablet Take 1 tablet (20 mg total) by mouth daily. (Patient not taking: Reported on 04/08/2020) 30 tablet 0   . traZODone (DESYREL) 50 MG tablet Take 1 tablet (50 mg total) by mouth at bedtime as needed for sleep. (Patient not taking: Reported on 04/08/2020) 30 tablet 0     Patient Stressors: Loss of Mother passed away 5 years ago in May 06, 2023, [place] coming Marital or family  conflict Traumatic event  Patient Strengths: Average or above average intelligence Communication skills Supportive family/friends  Treatment Modalities: Medication Management, Group therapy, Case management,  1 to 1 session with clinician, Psychoeducation, Recreational therapy.   Physician Treatment Plan for Primary Diagnosis: Major depressive disorder, recurrent severe without psychotic features ([place]) Long Term Goal(s):     Short Term Goals:    Medication Management: Evaluate patient's response, side effects, and tolerance of medication regimen.  Therapeutic Interventions: 1 to 1 sessions, Unit Group sessions and Medication administration.  Evaluation of Outcomes: Not Met  Physician Treatment Plan for Secondary Diagnosis: Principal Problem:   Major depressive disorder, recurrent severe without psychotic features ([place]) Active Problems:   Cannabis use disorder, severe, dependence ([place])   MDD (major depressive disorder), severe ([place])  Long Term Goal(s):     Short Term Goals:       Medication Management: Evaluate patient's response, side effects, and tolerance of medication regimen.  Therapeutic Interventions: 1 to 1 sessions, Unit Group sessions and Medication administration.  Evaluation of Outcomes: Not Met   RN Treatment Plan for Primary Diagnosis: Major depressive disorder, recurrent severe without psychotic features ([place]) Long Term Goal(s): Knowledge of disease and therapeutic regimen to maintain health will improve  Short Term Goals: Ability to verbalize feelings will improve, Ability to disclose and discuss suicidal ideas, Ability to identify and develop effective coping behaviors will improve and Compliance with prescribed medications will improve  Medication Management: RN will administer medications as ordered by provider, will assess and evaluate  patient's response and provide education to patient for prescribed medication. RN will report any adverse and/or side  effects to prescribing provider.  Therapeutic Interventions: 1 on 1 counseling sessions, Psychoeducation, Medication administration, Evaluate responses to treatment, Monitor vital signs and CBGs as ordered, Perform/monitor CIWA, COWS, AIMS and Fall Risk screenings as ordered, Perform wound care treatments as ordered.  Evaluation of Outcomes: Not Met   LCSW Treatment Plan for Primary Diagnosis: Major depressive disorder, recurrent severe without psychotic features ([place]) Long Term Goal(s): Safe transition to appropriate next level of care at discharge, Engage patient in therapeutic group addressing interpersonal concerns.  Short Term Goals: Engage patient in aftercare planning with referrals and resources  Therapeutic Interventions: Assess for all discharge needs, 1 to 1 time with Social worker, Explore available resources and support systems, Assess for adequacy in community support network, Educate family and significant other(s) on suicide prevention, Complete Psychosocial Assessment, Interpersonal group therapy.  Evaluation of Outcomes: Not Met   Progress in Treatment: Attending groups: No. New to unit  Participating in groups: No. Taking medication as prescribed: Yes. Toleration medication: Yes. Family/Significant other contact made: No, will contact:  if patient consents to collateral contacts Patient understands diagnosis: Yes. Discussing patient identified problems/goals with staff: Yes. Medical problems stabilized or resolved: Yes. Denies suicidal/homicidal ideation: Yes. Issues/concerns per patient self-inventory: No. Other:   New problem(s) identified: None   New Short Term/Long Term Goal(s): Detox, medication stabilization, elimination of SI thoughts, development of comprehensive mental wellness plan.    Patient Goals: "I want to be able to control my thoughts more. I don't want to obsess over someone not being there"   Discharge Plan or Barriers: Patient recently  admitted. CSW will continue to follow and assess for appropriate referrals and possible discharge planning.    Reason for Continuation of Hospitalization: Anxiety Depression Medication stabilization Suicidal ideation  Estimated Length of Stay: 3-5 days   Attendees: Patient: [name]  04/08/2020 8:46 AM  Physician: Dr. Myles Lipps, MD 04/08/2020 8:46 AM  Nursing:  04/08/2020 8:46 AM  RN Care Manager: 04/08/2020 8:46 AM  Social Worker: Radonna Ricker, LCSW 04/08/2020 8:46 AM  Recreational Therapist:  04/08/2020 8:46 AM  Other:  04/08/2020 8:46 AM  Other:  04/08/2020 8:46 AM  Other: 04/08/2020 8:46 AM    Scribe for Treatment Team: Marylee Floras, [place] 04/08/2020 8:46 AM

## 2020-04-08 NOTE — BH Assessment (Signed)
Tele Assessment Note   Patient Name: [name] MRN: [id number] Referring Physician: Kennith Maes, PA Location of Patient: [facility] Location of Provider: [place] is an 24 y.o. female.  -Clinician reviewed note by Kennith Maes, PA.  WARDA MCQUEARY is a 24 y.o. female with history of anxiety and depression who presents to the emergency department for evaluation of suicidal thoughts today. Patient states she has had increasing depression recently and today when her girlfriend broke up with her this significantly worsened leading to thoughts of suicide. She states she had plan to hang herself, she had a rope and went to the woods but did not follow through on plan. She did cut herself some on the R inner thigh. No other alleviating/aggravating factors.  Denies homicidal ideations or hallucinations.  She does utilize marijuana, no other drug use.  Denies alcohol use today.  Patient said that she and her girlfriend had been having relationship problems for the last week or so.  Girlfriend had been living with her and her father for the past year.  This past week patient said that girlfriend had been away at work.  Patient said that she has been very depressed about girlfriend.  Today she got a rope and went to the woods to hang herself.  Patient said "I didn't do it because I didn't know how to do the knot."  Patient also had cut herself on her left thigh.  Patient has had one previous suicide attempt five years ago.  Patient said that later in the day she got a text message from girlfriend about how she was moving out and would be picking up her things tomorrow.  Pt became tearful relating this to clinician.  Patient denies any HI or A/V hallucinations.  She does admit to smoking marijuana daily.  Last use was this morning.  Patient is tearful during most of the assessment.  She has fair eye contact.  She said that her mother died on 05/25/2023 about  five years ago.  Patient says she feels alone all the time.  She says she does have some friends and she makes herself interact.  Pt does not respond to internal stimuli.  She has logical and coherent thought processes.  She reports poor sleep and poor appetite.  Patient said she has reached out today to a former therapist.  She has no one now.  Pt was at [facility] in 03;2016.  Pt is willing to sign in voluntarily to a behavioral health hospital.  -Clinician discussed patient care with Ysidro Evert, NP who recommends inpatient care.  Clinician let Dr. Betsey Holiday know of disposition.  AC Mechele Claude said a bed may be available.  Diagnosis: F33.2 MDD recurrent, severe; F12.20 Cannabis use d/o severe  Past Medical History:  Past Medical History:  Diagnosis Date  . Anxiety   . Depression   . Medical history non-contributory     Past Surgical History:  Procedure Laterality Date  . TYMPANOSTOMY TUBE PLACEMENT      Family History: No family history on file.  Social History:  reports that she has never smoked. She has never used smokeless tobacco. She reports that she does not drink alcohol or use drugs.  Additional Social History:  Alcohol / Drug Use Pain Medications: None Prescriptions: None Over the Counter: None History of alcohol / drug use?: Yes Substance #1 Name of Substance 1: Marijuana 1 - Age of First Use: 24 years of age 52 -  Amount (size/oz): About a joint 1 - Frequency: Daily 1 - Duration: ongoing 1 - Last Use / Amount: 04/11  CIWA: CIWA-Ar BP: 137/77 Pulse Rate: 97 COWS:    Allergies: No Known Allergies  Home Medications: (Not in a hospital admission)   OB/GYN Status:  No LMP recorded.  General Assessment Data Location of Assessment: [facility] TTS Assessment: In system Is this a Tele or Face-to-Face Assessment?: Tele Assessment Is this an Initial Assessment or a Re-assessment for this encounter?: Initial Assessment Patient Accompanied by:: N/A Language Other than  English: No Living Arrangements: Other (Comment)(Living with father.) What gender do you identify as?: Female Marital status: Single Pregnancy Status: No Living Arrangements: Parent Can pt return to current living arrangement?: Yes Admission Status: Voluntary Is patient capable of signing voluntary admission?: Yes Referral Source: Self/Family/Friend(Father brought her to the hospital.) Insurance type: self pay     Crisis Care Plan Living Arrangements: Parent Name of Psychiatrist: None Name of Therapist: None  Education Status Is patient currently in school?: Yes Current Grade: N/A Highest grade of school patient has completed: N/A Name of school: [employer] Contact person: patient IEP information if applicable: N/A  Risk to self with the past 6 months Suicidal Ideation: Yes-Currently Present Has patient been a risk to self within the past 6 months prior to admission? : No Suicidal Intent: Yes-Currently Present Has patient had any suicidal intent within the past 6 months prior to admission? : No Is patient at risk for suicide?: Yes Suicidal Plan?: Yes-Currently Present Has patient had any suicidal plan within the past 6 months prior to admission? : No Specify Current Suicidal Plan: Hang self Access to Means: Yes Specify Access to Suicidal Means: Rope What has been your use of drugs/alcohol within the last 12 months?: Marijuana Previous Attempts/Gestures: Yes How many times?: 2 Other Self Harm Risks: Yes Triggers for Past Attempts: Anniversary(Mother died on May 30, 2015) Intentional Self Injurious Behavior: Cutting Comment - Self Injurious Behavior: Today Family Suicide History: No Recent stressful life event(s): Loss (Comment)(Girlfriend break up; anniversary of mother's death coming up) Persecutory voices/beliefs?: Yes Depression: Yes Depression Symptoms: Despondent, Tearfulness, Insomnia, Guilt, Isolating, Loss of interest in usual pleasures, Feeling worthless/self  pity Substance abuse history and/or treatment for substance abuse?: No Suicide prevention information given to non-admitted patients: Not applicable  Risk to Others within the past 6 months Homicidal Ideation: No Does patient have any lifetime risk of violence toward others beyond the six months prior to admission? : No Thoughts of Harm to Others: No Current Homicidal Intent: No Current Homicidal Plan: No Access to Homicidal Means: No Identified Victim: No one History of harm to others?: No Assessment of Violence: None Noted Violent Behavior Description: None Does patient have access to weapons?: Yes (Comment)(Guns are in a gun safe) Criminal Charges Pending?: No Does patient have a court date: No Is patient on probation?: No  Psychosis Hallucinations: None noted Delusions: None noted  Mental Status Report Appearance/Hygiene: Unremarkable, In scrubs Eye Contact: Fair Motor Activity: Freedom of movement, Unremarkable Speech: Logical/coherent Level of Consciousness: Alert, Crying Mood: Depressed, Helpless, Sad Affect: Anxious, Sad, Depressed Anxiety Level: Panic Attacks Panic attack frequency: Multiple times a day Most recent panic attack: Today Thought Processes: Coherent, Relevant Judgement: Impaired Orientation: Person, Place, Situation, Time Obsessive Compulsive Thoughts/Behaviors: None  Cognitive Functioning Concentration: Decreased Memory: Recent Impaired, Remote Intact Is patient IDD: No Insight: Good Impulse Control: Poor Appetite: Poor Have you had any weight changes? : No Change Sleep: Decreased Total Hours of Sleep: (<  4H/D for the last week.) Vegetative Symptoms: None  ADLScreening [facility] Assessment Services) Patient's cognitive ability adequate to safely complete daily activities?: Yes Patient able to express need for assistance with ADLs?: Yes Independently performs ADLs?: Yes (appropriate for developmental age)  Prior Inpatient Therapy Prior  Inpatient Therapy: Yes Prior Therapy Dates: 02/2015 Prior Therapy Facilty/Provider([facility]): [facility] Reason for Treatment: SI  Prior Outpatient Therapy Prior Outpatient Therapy: Yes Prior Therapy Dates: 4-5 years ago Prior Therapy Facilty[facility]Provider([facility] Reason for Treatment: counseling Does patient have an ACCT team?: No Does patient have Intensive In-House Services?  : No Does patient have [facility] services? : No Does patient have P4CC services?: No  ADL Screening (condition at time of admission) Patient's cognitive ability adequate to safely complete daily activities?: Yes Is the patient deaf or have difficulty hearing?: No Does the patient have difficulty seeing, even when wearing glasses/contacts?: No Does the patient have difficulty concentrating, remembering, or making decisions?: Yes Patient able to express need for assistance with ADLs?: Yes Does the patient have difficulty dressing or bathing?: No Independently performs ADLs?: Yes (appropriate for developmental age) Does the patient have difficulty walking or climbing stairs?: No Weakness of Legs: None Weakness of Arms/Hands: None       Abuse/Neglect Assessment (Assessment to be complete while patient is alone) Abuse/Neglect Assessment Can Be Completed: Yes Physical Abuse: Denies Verbal Abuse: Denies Sexual Abuse: Denies Exploitation of patient/patient's resources: Denies Self-Neglect: Denies     Merchant navy officer (For Healthcare) Does Patient Have a Medical Advance Directive?: No Would patient like information on creating a medical advance directive?: No - Patient declined          Disposition:  Disposition Initial Assessment Completed for this Encounter: Yes Patient referred to: Other (Comment)(To be reviewed by [facility])  This service was provided via telemedicine using a 2-way, interactive audio and video technology.  Names of all persons participating in this telemedicine service and their role in  this encounter. Name: [name] Role: patient  Name: Beatriz Stallion, M.S. LCAS QP Role: clinician  Name: Role:   Name:  Role:     [place]and[place] Lodge 04/08/2020 12:11 AM

## 2020-04-08 NOTE — Progress Notes (Signed)
Patient shared in group that she had a rough start to her day when she found out that her girlfriend is moving out. The girlfriend initially said that she would not move out till the patient returned home but she changed her mind. Her goal for tomorrow is to learn how to be more independent.

## 2020-04-08 NOTE — Tx Team (Signed)
Initial Treatment Plan 04/08/2020 6:04 AM Franky Macho CLT:[id number]    PATIENT STRESSORS: Loss of Mother passed away 5 years ago in 2023-06-03, anniversay coming Marital or family conflict Traumatic event   PATIENT STRENGTHS: Average or above average intelligence Communication skills Supportive family/friends   PATIENT IDENTIFIED PROBLEMS: Depression  Anxiety  Suicidal Ideation        "I just need help"         DISCHARGE CRITERIA:  Improved stabilization in mood, thinking, and/or behavior Motivation to continue treatment in a less acute level of care Need for constant or close observation no longer present Verbal commitment to aftercare and medication compliance  PRELIMINARY DISCHARGE PLAN: Outpatient therapy Return to previous living arrangement Return to previous work or school arrangements  PATIENT/FAMILY INVOLVEMENT: This treatment plan has been presented to and reviewed with the patient, MALILLANY KAZLAUSKAS.  The patient and family have been given the opportunity to ask questions and make suggestions.  Juliann Pares, RN 04/08/2020, 6:04 AM

## 2020-04-08 NOTE — ED Notes (Signed)
[facility] arrived for transport.  

## 2020-04-09 DIAGNOSIS — F332 Major depressive disorder, recurrent severe without psychotic features: Principal | ICD-10-CM

## 2020-04-09 MED ORDER — VENLAFAXINE HCL ER 75 MG PO CP24
75.0000 mg | ORAL_CAPSULE | Freq: Every day | ORAL | Status: DC
  Administered 2020-04-10 – 2020-04-11 (×2): 75 mg via ORAL
  Filled 2020-04-09 (×3): qty 1
  Filled 2020-04-09: qty 7

## 2020-04-09 NOTE — BHH Group Notes (Signed)
Adult Psychoeducational Group Note  Date:  04/09/2020 Time:  9:57 PM  Group Topic/Focus:  Wrap-Up Group:   The focus of this group is to help patients review their daily goal of treatment and discuss progress on daily workbooks.  Participation Level:  Active  Participation Quality:  Appropriate and Attentive  Affect:  Appropriate  Cognitive:  Alert and Appropriate  Insight: Appropriate and Good  Engagement in Group:  Engaged  Modes of Intervention:  Discussion and Education  Additional Comments:  Pt attended and participated in wrap up group this evening and rated their day an 8/10. Pt received lots of good news from home. Pt's therapists let them know they have room for them at their outpatient facility. Pt goal was to speak with the SW or the chaplain about grief therapy.   Chrisandra Netters 04/09/2020, 9:57 PM

## 2020-04-09 NOTE — Progress Notes (Signed)
[facility] MD Progress Note  04/09/2020 11:29 AM [name]  MRN:  [id number] Subjective:  Reports she is feeling better and states she has not experienced a sense of racing thoughts or of palpitations today. Denies suicidal ideations at this time. Objective : I have discussed case with treatment team and have met with patient. 24 year old female, lives with father, employed at a  Therapist, art . Presented to ED voluntarily for depression, suicidal ideations, episode of self cutting. Reports recent break up was a major trigger, but states that even prior to this she was already feeling sad, depressed, and had been experiencing suicidal ideations,with thoughts of hanging self .  Today patient reports some improvement compared to admission.  She describes feeling better.  She also states she is feeling less anxious.  She remains vaguely constricted in affect although tends to improve during session and smiles appropriately at times. She is currently on Effexor XR trial which she is tolerating well thus far , denies side effects. She reports improving sleep, and states slept better last night. Visible in day room, interacting appropriately with peers. TSH 1.86   Principal Problem: Major depressive disorder, recurrent severe without psychotic features ([place]) Diagnosis: Principal Problem:   Major depressive disorder, recurrent severe without psychotic features ([place]) Active Problems:   Cannabis use disorder, severe, dependence ([place])   MDD (major depressive disorder), severe ([place])   Major depression, recurrent ([place])  Total Time spent with patient: 20 minutes  Past Psychiatric History:   Past Medical History:  Past Medical History:  Diagnosis Date  . Anxiety   . Depression   . Medical history non-contributory     Past Surgical History:  Procedure Laterality Date  . TYMPANOSTOMY TUBE PLACEMENT     Family History: History reviewed. No pertinent family history. Family Psychiatric  History: Social  History:  Social History   Substance and Sexual Activity  Alcohol Use No     Social History   Substance and Sexual Activity  Drug Use No    Social History   Socioeconomic History  . Marital status: Single    Spouse name: Not on file  . Number of children: Not on file  . Years of education: Not on file  . Highest education level: Not on file  Occupational History  . Not on file  Tobacco Use  . Smoking status: Never Smoker  . Smokeless tobacco: Never Used  Substance and Sexual Activity  . Alcohol use: No  . Drug use: No  . Sexual activity: Never  Other Topics Concern  . Not on file  Social History Narrative  . Not on file   Social Determinants of Health   Financial Resource Strain:   . Difficulty of Paying Living Expenses:   Food Insecurity:   . Worried About Charity fundraiser in the Last Year:   . Arboriculturist in the Last Year:   Transportation Needs:   . Film/video editor (Medical):   Marland Kitchen Lack of Transportation (Non-Medical):   Physical Activity:   . Days of Exercise per Week:   . Minutes of Exercise per Session:   Stress:   . Feeling of Stress :   Social Connections:   . Frequency of Communication with Friends and Family:   . Frequency of Social Gatherings with Friends and Family:   . Attends Religious Services:   . Active Member of Clubs or Organizations:   . Attends Archivist Meetings:   Marland Kitchen Marital  Status:    Additional Social History:   Sleep: improving   Appetite:  fair  Current Medications: Current Facility-Administered Medications  Medication Dose Route Frequency Provider Last Rate Last Admin  . acetaminophen (TYLENOL) tablet 650 mg  650 mg Oral Q6H PRN Caroline Sauger, NP      . alum & mag hydroxide-simeth (MAALOX/MYLANTA) 200-200-20 MG/5ML suspension 30 mL  30 mL Oral Q4H PRN Caroline Sauger, NP      . feeding supplement (ENSURE ENLIVE) (ENSURE ENLIVE) liquid 237 mL  237 mL Oral BID BM Sharma Covert, MD   237  mL at 04/08/20 1558  . hydrOXYzine (ATARAX/VISTARIL) tablet 25 mg  25 mg Oral Q6H PRN Caroline Sauger, NP   25 mg at 04/08/20 2211  . LORazepam (ATIVAN) tablet 0.5 mg  0.5 mg Oral Q6H PRN Sharma Covert, MD   0.5 mg at 04/08/20 1859  . magnesium hydroxide (MILK OF MAGNESIA) suspension 30 mL  30 mL Oral Daily PRN Caroline Sauger, NP      . traZODone (DESYREL) tablet 50 mg  50 mg Oral QHS PRN Sharma Covert, MD   50 mg at 04/08/20 2211  . venlafaxine XR (EFFEXOR-XR) 24 hr capsule 37.5 mg  37.5 mg Oral Q breakfast Sharma Covert, MD   37.5 mg at 04/09/20 1937    Lab Results:  Results for orders placed or performed during the hospital encounter of 04/08/20 (from the past 48 hour(s))  TSH     Status: None   Collection Time: 04/08/20  6:26 PM  Result Value Ref Range   TSH 1.864 0.350 - 4.500 uIU/mL    Comment: Performed by a 3rd Generation assay with a functional sensitivity of <=0.01 uIU/mL. Performed at [facility], [place] [address] [place], [place] [address]     Blood Alcohol level:  Lab Results  Component Value Date   ETH <10 04/07/2020   ETH <5 97/35/3299    Metabolic Disorder Labs: Lab Results  Component Value Date   HGBA1C 5.3 03/25/2015   MPG 105 03/25/2015   No results found for: PROLACTIN No results found for: CHOL, TRIG, HDL, CHOLHDL, VLDL, LDLCALC  Physical Findings: AIMS: Facial and Oral Movements Muscles of Facial Expression: None, normal Lips and Perioral Area: None, normal Jaw: None, normal Tongue: None, normal,Extremity Movements Upper (arms, wrists, hands, fingers): None, normal Lower (legs, knees, ankles, toes): None, normal, Trunk Movements Neck, shoulders, hips: None, normal, Overall Severity Severity of abnormal movements (highest score from questions above): None, normal Incapacitation due to abnormal movements: None, normal Patient's awareness of abnormal movements (rate only patient's report): No Awareness,  Dental Status Current problems with teeth and/or dentures?: No Does patient usually wear dentures?: No  CIWA:    COWS:     Musculoskeletal: Strength & Muscle Tone: within normal limits Gait & Station: normal Patient leans: N/A  Psychiatric Specialty Exam: Physical Exam  Review of Systems no headache, no chest pain, no shortness of breath, describes some nausea, but denies vomiting  Blood pressure 116/79, pulse (!) 105, temperature 98.6 F (37 C), temperature source Oral, resp. rate 20, height 5' 3" (1.6 m), weight 71.7 kg, last menstrual period 03/08/2020, SpO2 100 %.Body mass index is 27.99 kg/m.  General Appearance: Well Groomed  Eye Contact:  Good  Speech:  Normal Rate  Volume:  Normal  Mood:  reports improving mood , states she is feeling better  Affect:  vaguely constricted, does smile briefly at times during session  Thought  Process:  Linear and Descriptions of Associations: Intact  Orientation:  Other:  fully alert and attentive  Thought Content:  no hallucinations, no delusions, not internally preoccupied   Suicidal Thoughts:  No denies any suicidal or self injurious ideations, contracts for safety on unit, denies homicidal or violent ideations  Homicidal Thoughts:  No  Memory:  recent and remote grossly intact   Judgement:  improving  Insight:  Fair/ improving  Psychomotor Activity:  Normal  Concentration:  Concentration: Good and Attention Span: Good  Recall:  Good  Fund of Knowledge:  Good  Language:  Good  Akathisia:  Negative  Handed:  Right  AIMS (if indicated):     Assets:  Communication Skills Desire for Improvement Resilience  ADL's:  Intact  Cognition:  WNL  Sleep:  Number of Hours: 6.5   Assessment-  24 year old female, lives with father, employed at a  Therapist, art . Presented to ED voluntarily for depression, suicidal ideations, episode of self cutting. Reports recent break up was a major trigger, but states that even prior to this she was already  feeling sad, depressed, and had been experiencing suicidal ideations,with thoughts of hanging self .  Today patient reports some improvement compared to admission.  She remains vaguely depressed and constricted but affect tends to improve during session and smiles at times appropriately.  Currently denies SI and presents future oriented.  She is currently on Effexor XR trial which she is tolerating well thus far.  Treatment Plan Summary: Daily contact with patient to assess and evaluate symptoms and progress in treatment, Medication management, Plan inpatient treatment  and medications as below  Encourage group and milieu participation Increase Effexor XR to 75 mgrs QDAY for depression, anxiety Continue Ativan 0.5 mgrs Q 6 hours PRN for anxiety Continue Trazodone 50 mg nightly as needed for insomnia as needed .  Team working on disposition planning options [name], MD 04/09/2020, 11:29 AM
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
## 2020-04-09 NOTE — Progress Notes (Signed)
Recreation Therapy Notes  Animal-Assisted Activity (AAA) Program Checklist/Progress Notes Patient Eligibility Criteria Checklist & Daily Group note for Rec Tx Intervention  Date: 4.13.21 Time: 1430 Location: [address] Dayroom   AAA/T Program Assumption of Risk Form signed by Patient/ or Parent Legal Guardian  YES   Patient is free of allergies or sever asthma  YES   Patient reports no fear of animals  YES  Patient reports no history of cruelty to animals YES   Patient understands his/her participation is voluntary  YES   Patient washes hands before animal contact  YES  Patient washes hands after animal contact  YES     Behavioral Response: Engaged  Education: Hand Washing, Appropriate Animal Interaction   Education Outcome: Acknowledges understanding/In group clarification offered/Needs additional education.   Clinical Observations/Feedback: Pt attended and participated in activity.    Kyarra Vancamp, LRT/CTRS         Rodolphe Edmonston A 04/09/2020 3:27 PM 

## 2020-04-09 NOTE — Progress Notes (Signed)
   04/09/20 1500  Psych Admission Type (Psych Patients Only)  Admission Status Voluntary  Psychosocial Assessment  Patient Complaints Anxiety  Eye Contact Fair  Facial Expression Sad;Trembling lip  Affect Anxious;Sad;Depressed  Speech Soft;Logical/coherent  Interaction Cautious;Minimal  Motor Activity Other (Comment) (WDL)  Appearance/Hygiene In scrubs  Behavior Characteristics Cooperative  Mood Anxious  Thought Process  Coherency WDL  Content WDL  Delusions None reported or observed  Perception WDL  Hallucination None reported or observed  Judgment Impaired  Confusion None  Danger to Self  Current suicidal ideation? Denies  Danger to Others  Danger to Others None reported or observed

## 2020-04-10 DIAGNOSIS — F1220 Cannabis dependence, uncomplicated: Secondary | ICD-10-CM

## 2020-04-10 DIAGNOSIS — F332 Major depressive disorder, recurrent severe without psychotic features: Secondary | ICD-10-CM | POA: Diagnosis not present

## 2020-04-10 MED ORDER — ONDANSETRON HCL 4 MG PO TABS
4.0000 mg | ORAL_TABLET | Freq: Once | ORAL | Status: AC
  Administered 2020-04-10: 4 mg via ORAL
  Filled 2020-04-10 (×2): qty 1

## 2020-04-10 NOTE — BHH Group Notes (Signed)
04/10/2020 8:45am Type of Group and Topic: Psychoeducational Group: Discharge Planning  Participation Level: Active  Description of Group Discharge planning group reviews patient's anticipated discharge plans and assists patients to anticipate and address any barriers to wellness/recovery in the community. Suicide prevention education is reviewed with patients in group. Therapeutic Goals 1. Patients will state their anticipated discharge plan and mental health aftercare 2. Patients will identify potential barriers to wellness in the community setting 3. Patients will engage in problem solving, solution focused discussion of ways to anticipate and address barriers to wellness/recovery   Summary of Patient Progress Plan for Discharge/Comments: [name] reports she is confident about discharging tomorrow. She reports she feels "much better" compared to how she felt during her admission. [name] reports she understands that she has to focus on herself and not on her ex-girlfriend. [name] reports she has gained insight on coping mechanisms that will assist her moving forward.   Transportation Means: Friend or her father    Supports: Father, close friends    Therapeutic Modalities: Motivational Interviewing     Baldo Daub, MSW, [employer] Clinical Social Worker [facility]  Phone: [phone] 04/10/2020 3:28 PM

## 2020-04-10 NOTE — Progress Notes (Signed)
[facility] Group Notes:  (Nursing/MHT/Case Management/Adjunct)  Date:  04/10/2020  Time:  2030 Type of Therapy:  wrap up group  Participation Level:  Active  Participation Quality:  Appropriate, Attentive, Sharing and Supportive  Affect:  Appropriate  Cognitive:  Appropriate  Insight:  Improving  Engagement in Group:  Engaged  Modes of Intervention:  Clarification, Education and Support  Summary of Progress/Problems: Positive thinking and positive change were discussed.   Marcille Buffy 04/10/2020, 9:23 PM

## 2020-04-10 NOTE — Progress Notes (Signed)
   04/10/20 2303  Psych Admission Type (Psych Patients Only)  Admission Status Voluntary  Psychosocial Assessment  Patient Complaints Anxiety;Depression  Eye Contact Fair  Facial Expression Anxious  Affect Anxious;Depressed  Speech Soft;Logical/coherent  Interaction Assertive;Minimal  Motor Activity Other (Comment) (WNL)  Appearance/Hygiene Unremarkable  Behavior Characteristics Cooperative;Anxious  Mood Depressed;Anxious  Thought Process  Coherency WDL  Content WDL  Delusions None reported or observed  Perception WDL  Hallucination None reported or observed  Judgment Impaired  Confusion None  Danger to Self  Current suicidal ideation? Denies  Danger to Others  Danger to Others None reported or observed   Pt is feeling good today. Pt is looking forward to discharge in a day or two so that she can see her family. Pt encouraged to keep forward thinking. Rates anxiety 4/10. Spoke about how she was able to calm herself down when she had a moment of heightened anxiety earlier in the day. Pt nausea lessened after one time dose of Zofran 4 mg this morning. Pt encouraged to increase dietary intake slowly and that this feeling will lessen even more with regular meals. Pt intake was poor prior to admission.

## 2020-04-10 NOTE — Progress Notes (Addendum)
[facility] MD Progress Note  04/10/2020 11:03 AM [name]  MRN:  [id number]   Subjective:  "I am feeling pretty good and I am optimistic."  Objective : [name] is a 24 y.o. female who presented voluntarily to [facility] on 04/07/2020 for depression, suicidal ideations, and an episode of self cutting. Reported recent break up was a major trigger, but stated that even prior to this she was already feeling sad, depressed, and had been experiencing suicidal ideations with thoughts of hanging herself. On evaluation today the patient is sitting on the side of the bed in her room. She has been observed interacting appropriately with her peers in the dayroom. She reports that she is feeling better. She rates her depression today as 0 out of 10 and anxiety as 1 out of 10 with 10 being the most severe. States that she slept from 11 pm-6 am without waking up during the night. Per chart review, she slept 6.75 hours. She reports that her appetite is improving. She is currently prescribed Effexor XR 75 mg daily and states that she is tolerating well without any side effects. She denies suicidal ideations. She is able to verbally contract for safety while in the hospital. She denies homicidal ideations. She denies auditory and visual hallucinations.    From previous note: Today patient reports some improvement compared to admission.  She describes feeling better.  She also states she is feeling less anxious.  She remains vaguely constricted in affect although tends to improve during session and smiles appropriately at times. She is currently on Effexor XR trial which she is tolerating well thus far , denies side effects. She reports improving sleep, and states slept better last night. Visible in day room, interacting appropriately with peers. TSH 1.86   Principal Problem: Major depressive disorder, recurrent severe without psychotic features (HCC) Diagnosis: Principal Problem:   Major  depressive disorder, recurrent severe without psychotic features (HCC) Active Problems:   Cannabis use disorder, severe, dependence (HCC)   MDD (major depressive disorder), severe (HCC)   Major depression, recurrent (HCC)  Total Time spent with patient: 15 minutes  Past Psychiatric History: See admission H&P  Past Medical History:  Past Medical History:  Diagnosis Date  . Anxiety   . Depression   . Medical history non-contributory     Past Surgical History:  Procedure Laterality Date  . TYMPANOSTOMY TUBE PLACEMENT     Family History: History reviewed. No pertinent family history. Family Psychiatric  History: See admission H&P Social History:  Social History   Substance and Sexual Activity  Alcohol Use No     Social History   Substance and Sexual Activity  Drug Use No    Social History   Socioeconomic History  . Marital status: Single    Spouse name: Not on file  . Number of children: Not on file  . Years of education: Not on file  . Highest education level: Not on file  Occupational History  . Not on file  Tobacco Use  . Smoking status: Never Smoker  . Smokeless tobacco: Never Used  Substance and Sexual Activity  . Alcohol use: No  . Drug use: No  . Sexual activity: Never  Other Topics Concern  . Not on file  Social History Narrative  . Not on file   Social Determinants of Health   Financial Resource Strain:   . Difficulty of Paying Living Expenses:   Food Insecurity:   . Worried About Radiation protection practitioner  of Food in the Last Year:   . Ran Out of Food in the Last Year:   Transportation Needs:   . Lack of Transportation (Medical):   Marland Kitchen Lack of Transportation (Non-Medical):   Physical Activity:   . Days of Exercise per Week:   . Minutes of Exercise per Session:   Stress:   . Feeling of Stress :   Social Connections:   . Frequency of Communication with Friends and Family:   . Frequency of Social Gatherings with Friends and Family:   . Attends Religious  Services:   . Active Member of Clubs or Organizations:   . Attends Banker Meetings:   Marland Kitchen Marital Status:    Additional Social History:   Sleep: Good   Appetite:  fair/improving  Current Medications: Current Facility-Administered Medications  Medication Dose Route Frequency Provider Last Rate Last Admin  . acetaminophen (TYLENOL) tablet 650 mg  650 mg Oral Q6H PRN Gillermo Murdoch, NP      . alum & mag hydroxide-simeth (MAALOX/MYLANTA) 200-200-20 MG/5ML suspension 30 mL  30 mL Oral Q4H PRN Gillermo Murdoch, NP      . feeding supplement (ENSURE ENLIVE) (ENSURE ENLIVE) liquid 237 mL  237 mL Oral BID BM Antonieta Pert, MD   237 mL at 04/10/20 0924  . LORazepam (ATIVAN) tablet 0.5 mg  0.5 mg Oral Q6H PRN Antonieta Pert, MD   0.5 mg at 04/09/20 2217  . magnesium hydroxide (MILK OF MAGNESIA) suspension 30 mL  30 mL Oral Daily PRN Gillermo Murdoch, NP      . traZODone (DESYREL) tablet 50 mg  50 mg Oral QHS PRN Antonieta Pert, MD   50 mg at 04/09/20 2217  . venlafaxine XR (EFFEXOR-XR) 24 hr capsule 75 mg  75 mg Oral Q breakfast Terrius Gentile, Rockey Situ, MD   75 mg at 04/10/20 8315    Lab Results:  Results for orders placed or performed during the hospital encounter of 04/08/20 (from the past 48 hour(s))  TSH     Status: None   Collection Time: 04/08/20  6:26 PM  Result Value Ref Range   TSH 1.864 0.350 - 4.500 uIU/mL    Comment: Performed by a 3rd Generation assay with a functional sensitivity of <=0.01 uIU/mL. Performed at [facility], [address], [place], [place] [address]     Blood Alcohol level:  Lab Results  Component Value Date   ETH <10 04/07/2020   ETH <5 03/24/2015    Metabolic Disorder Labs: Lab Results  Component Value Date   HGBA1C 5.3 03/25/2015   MPG 105 03/25/2015   No results found for: PROLACTIN No results found for: CHOL, TRIG, HDL, CHOLHDL, VLDL, LDLCALC  Physical Findings: AIMS: Facial and Oral  Movements Muscles of Facial Expression: None, normal Lips and Perioral Area: None, normal Jaw: None, normal Tongue: None, normal,Extremity Movements Upper (arms, wrists, hands, fingers): None, normal Lower (legs, knees, ankles, toes): None, normal, Trunk Movements Neck, shoulders, hips: None, normal, Overall Severity Severity of abnormal movements (highest score from questions above): None, normal Incapacitation due to abnormal movements: None, normal Patient's awareness of abnormal movements (rate only patient's report): No Awareness, Dental Status Current problems with teeth and/or dentures?: No Does patient usually wear dentures?: No  CIWA:    COWS:     Musculoskeletal: Strength & Muscle Tone: within normal limits Gait & Station: normal Patient leans: N/A  Psychiatric Specialty Exam: Physical Exam  Nursing note and vitals reviewed. Constitutional: She  is oriented to person, place, and time. She appears well-developed and well-nourished. No distress.  Respiratory: Effort normal and breath sounds normal.  Musculoskeletal:        General: Normal range of motion.  Neurological: She is alert and oriented to person, place, and time.  Skin: She is not diaphoretic.  Psychiatric: Her mood appears anxious. She is not withdrawn and not actively hallucinating. Thought content is not paranoid and not delusional. She exhibits a depressed mood. She expresses no homicidal and no suicidal ideation.    Review of Systems  Constitutional: Negative for chills, diaphoresis, fatigue, fever and unexpected weight change.  HENT: Negative for congestion and sore throat.   Respiratory: Negative for cough and shortness of breath.   Cardiovascular: Negative for chest pain and palpitations.  Gastrointestinal: Positive for nausea. Negative for diarrhea and vomiting.  Neurological: Negative for dizziness and headaches.  Psychiatric/Behavioral: Negative for hallucinations. Dysphoric mood: Stable/improving  with medication. Nervous/anxious: Stable/Improving with medication.   All other systems reviewed and are negative.    Blood pressure 111/76, pulse (!) 143, temperature 98 F (36.7 C), temperature source Oral, resp. rate 20, height 5\' 3"  (1.6 m), weight 71.7 kg, last menstrual period 03/08/2020, SpO2 100 %.Body mass index is 27.99 kg/m.  General Appearance: Well Groomed  Eye Contact:  Good  Speech:  Normal Rate  Volume:  Normal  Mood:  reports improving mood , states she is feeling better  Affect:  Congruent  Thought Process:  Linear and Descriptions of Associations: Intact  Orientation:  Full (Time, Place, and Person)  Thought Content:  no hallucinations, no delusions, not internally preoccupied   Suicidal Thoughts:  No denies any suicidal or self injurious ideations, contracts for safety on unit, denies homicidal or violent ideations  Homicidal Thoughts:  No  Memory:  recent and remote grossly intact   Judgement:  Intact  Insight:  Fair/ improving  Psychomotor Activity:  Normal  Concentration:  Concentration: Good and Attention Span: Good  Recall:  Good  Fund of Knowledge:  Good  Language:  Good  Akathisia:  Negative  Handed:  Right  AIMS (if indicated):     Assets:  Communication Skills Desire for Improvement Resilience  ADL's:  Intact  Cognition:  WNL  Sleep:  Number of Hours: 6.75    Treatment Plan Summary: Daily contact with patient to assess and evaluate symptoms and progress in treatment, Medication management, Plan inpatient treatment  and medications as below    Continue inpatient admission for stabilization and treatment  Encourage group and milieu participation  Continue Effexor XR to 75 mgrs QDAY for depression, anxiety Continue Ativan 0.5 mgrs Q 6 hours PRN for anxiety Continue Trazodone 50 mg nightly as needed for insomnia as needed  Team working on disposition planning options  Rozetta Nunnery, NP 04/10/2020, 11:03 AM   Attest to NP note

## 2020-04-10 NOTE — Progress Notes (Signed)
   04/09/20 2200  Psych Admission Type (Psych Patients Only)  Admission Status Voluntary  Psychosocial Assessment  Patient Complaints Anxiety  Eye Contact Fair  Facial Expression Anxious  Affect Anxious;Sad;Depressed  Speech Soft;Logical/coherent  Interaction Cautious;Minimal  Motor Activity Other (Comment) (WNL)  Appearance/Hygiene Unremarkable  Behavior Characteristics Cooperative  Mood Anxious  Thought Process  Coherency WDL  Content WDL  Delusions None reported or observed  Perception WDL  Hallucination None reported or observed  Judgment Impaired  Confusion None  Danger to Self  Current suicidal ideation? Denies  Danger to Others  Danger to Others None reported or observed

## 2020-04-10 NOTE — Progress Notes (Signed)
Pt c/o nausea. States that since she has been here she has only eaten half a bowl of Fruit Loops. Contacted provider and given a one time dose of Zofran. States she wants to eat but feels to sick to eat.

## 2020-04-10 NOTE — Plan of Care (Signed)
Nurse discussed anxiety, depression and coping skills with patient.  

## 2020-04-10 NOTE — Progress Notes (Signed)
D:  Patient's self inventory sheet, patient sleeps good, sleep medication helpful.  Poor appetite, normal energy level, good concentration.  Rated depression and hopeless 1, anxiety 2.  Denied withdrawals.  Denied SI.  Does feel jittery and nauseated.  Denied physical pain.  Goal is discharge plan.  Plans to work on medications.  A:  Medications administered per MD orders.  Emotional support and encouragement given patient. R:  Denied SI and HI, contracts for safety.  Denied A/V hallucinations.  Safety maintained with 15 minute checks. '

## 2020-04-11 DIAGNOSIS — F332 Major depressive disorder, recurrent severe without psychotic features: Secondary | ICD-10-CM | POA: Diagnosis not present

## 2020-04-11 MED ORDER — TRAZODONE HCL 50 MG PO TABS: 50.0000 mg | ORAL_TABLET | Freq: Every evening | ORAL | 0 refills | Status: DC | PRN

## 2020-04-11 MED ORDER — VENLAFAXINE HCL ER 75 MG PO CP24: 75.0000 mg | ORAL_CAPSULE | Freq: Every day | ORAL | 0 refills | Status: DC

## 2020-04-11 NOTE — Progress Notes (Signed)
  [facility] Adult Case Management Discharge Plan :  Will you be returning to the same living situation after discharge:  Yes,  with father  At discharge, do you have transportation home?: Yes,  pt's friend and neighbor  Do you have the ability to pay for your medications: No. Self-pay  Release of information consent forms completed and in the chart;  Patient's signature needed at discharge.  Patient to Follow up at: Follow-up Information    [facility] Follow up on 04/17/2020.   Why: You are scheduled for an appointment for medication management on 04/17/20 at 9:30 am. This will be a virtual tele-health appointment. Contact information: [address] [place] [address] [phone]        [facility]. Go on 04/18/2020.   Why: You are scheduled for an in person appointment for therapy with Racheal Patches on 04/18/20 at 2:30 pm.  PLEASE BRING YOUR DISCHARGE SUMMARY TO THIS APPOINTMENT AS PROVIDER DOES NOT HAVE A FAX MACHINE YET. Contact information: [address]  [place], [place] [address]   P: [phone]            Next level of care provider has access to [facility] Link:no  Safety Planning and Suicide Prevention discussed: Yes,  with pt's father   Have you used any form of tobacco in the last 30 days? (Cigarettes, Smokeless Tobacco, Cigars, and/or Pipes): No  Has patient been referred to the Quitline?: N/A patient is not a smoker  Patient has been referred for addiction treatment: Yes  [name], Student-Social Work 04/11/2020, 8:55 AM

## 2020-04-11 NOTE — Progress Notes (Signed)
Pt discharged to lobby. Pt was stable and appreciative at that time. All papers, samples and prescriptions were given and valuables returned. Verbal understanding expressed. Denies SI/HI and A/VH. Pt given opportunity to express concerns and ask questions.  

## 2020-04-11 NOTE — BHH Suicide Risk Assessment (Signed)
[facility] Discharge Suicide Risk Assessment   Principal Problem: Major depressive disorder, recurrent severe without psychotic features (HCC) Discharge Diagnoses: Principal Problem:   Major depressive disorder, recurrent severe without psychotic features (HCC) Active Problems:   Cannabis use disorder, severe, dependence (HCC)   MDD (major depressive disorder), severe (HCC)   Major depression, recurrent (HCC)   Total Time spent with patient: 30 minutes  Musculoskeletal: Strength & Muscle Tone: within normal limits Gait & Station: normal Patient leans: N/A  Psychiatric Specialty Exam: Review of Systems no headache, no chest pain, no shortness of breath, no vomiting , no rash. Reports a subjective sense of tingling on L thumb after she woke  up this AM. No weakness or motor compromise, no hypoesthesia. No inflammation noted, preserved capillary perfusion.  Blood pressure 129/64, pulse (!) 127, temperature 97.9 F (36.6 C), temperature source Oral, resp. rate 16, height 5\' 3"  (1.6 m), weight 71.7 kg, last menstrual period 03/08/2020, SpO2 99 %.Body mass index is 27.99 kg/m.  General Appearance: Well Groomed  Eye Contact::  Good  Speech:  Normal Rate409  Volume:  Normal  Mood:  reports improved mood and describes mood as 9/10 with 10 being best   Affect:  Appropriate and more reactive   Thought Process:  Linear and Descriptions of Associations: Intact  Orientation:  Full (Time, Place, and Person)  Thought Content:  no hallucinations, no delusions, not internally preoccupied   Suicidal Thoughts:  No denies suicidal or self injurious ideations , denies homicidal or violent ideations  Homicidal Thoughts:  No  Memory:  recent and remote grossly intact   Judgement:  Other:  improving   Insight:  improving   Psychomotor Activity:  Normal- no psychomotor agitation or restlessness   Concentration:  Good  Recall:  Good  Fund of Knowledge:Good  Language: Good  Akathisia:  Negative  Handed:   Right  AIMS (if indicated):     Assets:  Communication Skills Desire for Improvement Resilience  Sleep:  Number of Hours: 6.25  Cognition: WNL  ADL's:  Intact   Mental Status Per Nursing Assessment::   On Admission:  Suicidal ideation indicated by patient, Suicidal ideation indicated by others, Suicide plan, Self-harm thoughts, Self-harm behaviors  Demographic Factors:  22 y old single female, lives with father   Loss Factors: Relationship stressors, recent break up   Historical Factors: History of depression, one prior psychiatric admission  Risk Reduction Factors:   Employed, Living with another person, especially a relative, Positive social support and Positive coping skills or problem solving skills  Continued Clinical Symptoms:  Today patient presents alert , attentive, well related, calm, pleasant on approach. Describes mood as improved /euthymic at this time. No thought disorder, denies suicidal or self injurious ideations, denies homicidal or violent ideations, no hallucinations, no delusions, not internally preoccupied, currently future oriented, planning on returning to college /completing semester and returning to work soon. Behavior on unit calm and in good control. Denies medication side effects - we reviewed side effect profile to include potential WDL symptoms if stops Venlafaxine abruptly and potential for increased suicidal ideations early in treatment with antidepressants in young adults   Cognitive Features That Contribute To Risk:  No gross cognitive deficits noted upon discharge. Is alert , attentive, and oriented x 3   Suicide Risk:  Mild:  Suicidal ideation of limited frequency, intensity, duration, and specificity.  There are no identifiable plans, no associated intent, mild dysphoria and related symptoms, good self-control (both objective and subjective assessment), few  other risk factors, and identifiable protective factors, including available and  accessible social support.  Follow-up Information    [facility] Follow up on 04/17/2020.   Why: You are scheduled for an appointment for medication management on 04/17/20 at 9:30 am. This will be a virtual tele-health appointment. Contact information: [place] [address] [phone]        [facility]. Go on 04/18/2020.   Why: You are scheduled for an in person appointment for therapy with Laveda Abbe on 04/18/20 at 2:30 pm.  PLEASE BRING YOUR DISCHARGE SUMMARY TO THIS APPOINTMENT AS PROVIDER DOES NOT HAVE A FAX MACHINE YET. Contact information: [address]  [place], [place] [address]   P: [phone]            Plan Of Care/Follow-up recommendations:  Activity:  as tolerated Diet:  regular Tests:  NA Other:  See below  Patient is expressing readiness for discharge and is leaving unit in good spirits  Plans to return home Plans to follow up as above   [name], MD 04/11/2020, 7:37 AM

## 2020-04-11 NOTE — Progress Notes (Signed)
Pt c/o tingling on the lateral side of her left hand this morning. States that she felt tingling on the lateral side of her left leg before she went to bed but this sensation subsided not long after she laid down. Told pt to let provider know when she speaks to him this morning. Pt in no apparent distress otherwise. Will continue to monitor.

## 2020-04-11 NOTE — Discharge Summary (Addendum)
Physician Discharge Summary Note  Patient:  [name] is an 24 y.o., female  MRN:  [id number]  DOB:  06/11/1996  Patient phone:  [phone] (home)   Patient address:   [address] [place] [address],   Total Time spent with patient: Greater than 30 minutes  Date of Admission:  04/08/2020  Date of Discharge: 04- 15-21  Reason for Admission:  [name] is a 24 y.o. female who presented voluntarily to [facility] Emergency Department on 04/07/2020 for depression, suicidal ideations, and an episode of self cutting.   Principal Problem: Major depressive disorder, recurrent severe without psychotic features [facility])  Discharge Diagnoses: Principal Problem:   Major depressive disorder, recurrent severe without psychotic features (HCC) Active Problems:   Cannabis use disorder, severe, dependence (HCC)   MDD (major depressive disorder), severe (HCC)   Major depression, recurrent (HCC)  Past Psychiatric History: Major depressive disorder  Past Medical History:  Past Medical History:  Diagnosis Date  . Anxiety   . Depression   . Medical history non-contributory     Past Surgical History:  Procedure Laterality Date  . TYMPANOSTOMY TUBE PLACEMENT     Family History: History reviewed. No pertinent family history.  Family Psychiatric  History: See H&P  Social History:  Social History   Substance and Sexual Activity  Alcohol Use No     Social History   Substance and Sexual Activity  Drug Use No    Social History   Socioeconomic History  . Marital status: Single    Spouse name: Not on file  . Number of children: Not on file  . Years of education: Not on file  . Highest education level: Not on file  Occupational History  . Not on file  Tobacco Use  . Smoking status: Never Smoker  . Smokeless tobacco: Never Used  Substance and Sexual Activity  . Alcohol use: No  . Drug use: No  . Sexual activity: Never  Other Topics Concern  . Not on file   Social History Narrative  . Not on file   Social Determinants of Health   Financial Resource Strain:   . Difficulty of Paying Living Expenses:   Food Insecurity:   . Worried About Programme researcher, broadcasting/film/video in the Last Year:   . Barista in the Last Year:   Transportation Needs:   . Freight forwarder (Medical):   Marland Kitchen Lack of Transportation (Non-Medical):   Physical Activity:   . Days of Exercise per Week:   . Minutes of Exercise per Session:   Stress:   . Feeling of Stress :   Social Connections:   . Frequency of Communication with Friends and Family:   . Frequency of Social Gatherings with Friends and Family:   . Attends Religious Services:   . Active Member of Clubs or Organizations:   . 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 The patient stated that she had been having relationship problems over the last couple of weeks. She stated that her girlfriend had decided to break up with her on the date of admission. She stated that she has been having problems with her girlfriend over the last  2 weeks. The patient admitted to being very dependent upon her, and always feeling as though they needed to be with someone. The girlfriend had been living with the patient as well as her father for the last year. The patient admitted that she had had a previous psychiatric hospitalization at our facility approximately 5 years ago. She stated that she took the Celexa medicine for a while after discharge, but then had been doing better and then quit taking it and did not follow-up afterwards. She also stated that she is under stress  secondary to the anniversary of the death of her mother in 06-09-23. She stated that when she was in the hospital 5 years ago her mother was well, but shortly after discharge her mother was diagnosed with cancer, and then passed away thereafter. The patient admitted to a history of suicidal ideation. She stated that when she was younger she cut her self as well as burned herself, and recently had started cutting again. She stated that she had a superficial cut on her right anterior thigh. During interview the patient was quite tearful and almost in a panic issue. She was given 1 mg of Ativan immediately to decrease her stress issues. She denied any additional psychiatric hospitalizations after her admission 5 years ago at our facility. Her discharge medications at that time included citalopram 10 mg p.o. daily and trazodone 50 mg p.o. nightly as needed. She was admitted to our facility for evaluation and stabilization.  [name] is a 24 y.o. female who presented voluntarily to [facility] Emergency Department on 04/07/2020 for depression, suicidal ideations, and an episode of self cutting. After meeting with the psychiatric team, Dr. Jola Babinski recommended starting venlafaxine XR to assist in managing depression and anxiety. Dr. Jola Babinski reviewed the risk/benefit and side effects of venlafaxine with the patient. The patient was in agreement with the plan. The patient was stared on venlafaxine XR 37.5 mg daily and it was titrated to 75 mg. The patient tolerated venlafaxine well without any side effects. Trazodone 50 mg QHS prn was restarted to improve sleep. The patient received ativan 0.5 mg prn for panic attacks. She was observed socializing appropriately with her peers and participating in the unit milieu and group therapy. She consistently denies suicidal thoughts during her hospitalization. [facility] social worker arranged outpatient medication management with Vesta Mixer and outpatient therapy with [facility].  During the course of her hospitalization, the 15-minute checks were adequate to ensure [name] safety. Patient did not display any dangerous violent or suicidal behavior on the unit. She interacted with patients and staff appropriately, participated appropriately in the group sessions/therapies. Her medications were addressed and adjusted to meet her needs. He was recommended for outpatient follow-up care and medication management upon discharge to assure continuity of care and mood stability. At the time of discharge the patient is not reporting any acute suicidal or homicidal ideations. She feels more confident about her self-care and in managing the suicidal ideations. [name] currently denies any new issues or concerns. Education and supportive counseling provided throughout the hospital stay and upon discharge.  Today upon her discharge evaluation with the attending psychiatrist, [name] shares that she is doing well. She denies any other specific concerns. She is sleeping well. Her appetite is good. She denies other physical complaints. She denies auditory and visual hallucinations. She feels that her medications have been helpful and is in agreement to continue her current treatment regimen. She was able to engage in safety planning,  including plan to return to [facility] or contact emergency services if she feels unable to maintain her own safety or the safety of others. Patient has no further questions, comments, or concerns. She left [facility] with all personal belonging and in no apparent distress. Transportation provided by the patient's friend.  Physical Findings: AIMS: Facial and Oral Movements Muscles of Facial Expression: None, normal Lips and Perioral Area: None, normal Jaw: None, normal Tongue: None, normal,Extremity Movements Upper (arms, wrists, hands, fingers): None, normal Lower (legs, knees, ankles, toes): None, normal, Trunk Movements Neck, shoulders, hips: None,  normal, Overall Severity Severity of abnormal movements (highest score from questions above): None, normal Incapacitation due to abnormal movements: None, normal Patient's awareness of abnormal movements (rate only patient's report): No Awareness, Dental Status Current problems with teeth and/or dentures?: No Does patient usually wear dentures?: No  CIWA:    COWS:     Musculoskeletal: Strength & Muscle Tone: within normal limits Gait & Station: normal Patient leans: N/A  Psychiatric Specialty Exam: Physical Exam  Nursing note and vitals reviewed. Constitutional: She is oriented to person, place, and time. She appears well-developed.  Cardiovascular: Normal rate.  Respiratory: Effort normal. No respiratory distress. She has no wheezes.  Genitourinary:    Genitourinary Comments: Deferred   Musculoskeletal:        General: Normal range of motion.     Cervical back: Normal range of motion.  Neurological: She is alert and oriented to person, place, and time.  Skin: Skin is warm and dry.    Review of Systems  Constitutional: Negative for chills and diaphoresis.  HENT: Negative for congestion, rhinorrhea, sneezing and sore throat.   Eyes: Negative for discharge.  Respiratory: Negative for cough, chest tightness, shortness of breath and wheezing.   Cardiovascular: Negative for chest pain and palpitations.  Gastrointestinal: Negative for diarrhea, nausea and vomiting.  Genitourinary: Negative for difficulty urinating.  Musculoskeletal: Negative for arthralgias and myalgias.  Skin: Negative.   Allergic/Immunologic: Negative for environmental allergies and food allergies.       NKDA  Neurological: Negative for dizziness, tremors, seizures and numbness.  Psychiatric/Behavioral: Positive for dysphoric mood (Stabilized with medication prior to discharge) and sleep disturbance (Stabilized with medication prior to discharge). Negative for agitation, behavioral problems, confusion, decreased  concentration, hallucinations, self-injury and suicidal ideas. The patient is not nervous/anxious (Stable) and is not hyperactive.     Blood pressure 129/64, pulse (!) 127, temperature 97.9 F (36.6 C), temperature source Oral, resp. rate 16, height 5\' 3"  (1.6 m), weight 71.7 kg, last menstrual period 03/08/2020, SpO2 99 %.Body mass index is 27.99 kg/m.  See Md's discharge SRA  Sleep:  Number of Hours: 6.25   Have you used any form of tobacco in the last 30 days? (Cigarettes, Smokeless Tobacco, Cigars, and/or Pipes): No  Has this patient used any form of tobacco in the last 30 days? (Cigarettes, Smokeless Tobacco, Cigars, and/or Pipes): N/A  Blood Alcohol level:  Lab Results  Component Value Date   ETH <10 04/07/2020   ETH <5 03/24/2015   Metabolic Disorder Labs:  Lab Results  Component Value Date   HGBA1C 5.3 03/25/2015   MPG 105 03/25/2015   No results found for: PROLACTIN No results found for: CHOL, TRIG, HDL, CHOLHDL, VLDL, LDLCALC  See Psychiatric Specialty Exam and Suicide Risk Assessment completed by Attending Physician prior to discharge.  Discharge destination:  Home  Is patient on multiple antipsychotic therapies at discharge:  No   Has Patient had three or  more failed trials of antipsychotic monotherapy by history:  No  Recommended Plan for Multiple Antipsychotic Therapies: NA  Allergies as of 04/11/2020   No Known Allergies     Medication List    STOP taking these medications   citalopram 10 MG tablet Commonly known as: CELEXA   dexamethasone 4 MG tablet Commonly known as: DECADRON   pantoprazole 20 MG tablet Commonly known as: PROTONIX     TAKE these medications     Indication  traZODone 50 MG tablet Commonly known as: DESYREL Take 1 tablet (50 mg total) by mouth at bedtime as needed for sleep.  Indication: Trouble Sleeping   venlafaxine XR 75 MG 24 hr capsule Commonly known as: EFFEXOR-XR Take 1 capsule (75 mg total) by mouth daily with  breakfast. For depression Start taking on: April 12, 2020  Indication: Major Depressive Disorder      Follow-up Information    [facility] Follow up on 04/17/2020.   Why: You are scheduled for an appointment for medication management on 04/17/20 at 9:30 am. This will be a virtual tele-health appointment. Contact information: [place] [address] [phone]        [place]. Go on 04/18/2020.   Why: You are scheduled for an in person appointment for therapy with Laveda Abbe on 04/18/20 at 2:30 pm.  PLEASE BRING YOUR DISCHARGE SUMMARY TO THIS APPOINTMENT AS PROVIDER DOES NOT HAVE A FAX MACHINE YET. Contact information: [address]  [place], [place] [address]   P: [phone]           Follow-up recommendations: Activity:  As tolerated Diet: As recommended by your primary care doctor. Keep all scheduled follow-up appointments as recommended.  Comments: Prescriptions given at discharge.  Patient agreeable to plan.  Given opportunity to ask questions.  Appears to feel comfortable with discharge denies any current suicidal or homicidal thought. Patient is also instructed prior to discharge to: Take all medications as prescribed by his/her mental healthcare provider. Report any adverse effects and or reactions from the medicines to his/her outpatient provider promptly. Patient has been instructed & cautioned: To not engage in alcohol and or illegal drug use while on prescription medicines. In the event of worsening symptoms, patient is instructed to call the crisis hotline, 911 and or go to the nearest ED for appropriate evaluation and treatment of symptoms. To follow-up with his/her primary care provider for your other medical issues, concerns and or health care needs.  Signed: Lindell Spar, NP, PMHNP, FNP-BC 04/11/2020, 9:36 AM   Patient seen, Suicide Assessment Completed.  Disposition Plan Reviewed

## 2020-04-11 NOTE — [facility] Suicide Risk Assessment (Cosign Needed)
[facility] INPATIENT:  Family/Significant Other Suicide Prevention Education  Suicide Prevention Education:  Education Completed; with father, [name] [phone]) has been identified by the patient as the family member/significant other with whom the patient will be residing, and identified as the person(s) who will aid the patient in the event of a mental health crisis (suicidal ideations/suicide attempt).  With written consent from the patient, the family member/significant other has been provided the following suicide prevention education, prior to the and/or following the discharge of the patient.  The suicide prevention education provided includes the following:  Suicide risk factors  Suicide prevention and interventions  National Suicide Hotline telephone number  [facility] assessment telephone number  [facility] Emergency Assistance 911  [facility] and/or Residential Mobile Crisis Unit telephone number  Request made of family/significant other to:  Remove weapons (e.g., guns, rifles, knives), all items previously/currently identified as safety concern.    Remove drugs/medications (over-the-counter, prescriptions, illicit drugs), all items previously/currently identified as a safety concern.  The family member/significant other verbalizes understanding of the suicide prevention education information provided.  The family member/significant other agrees to remove the items of safety concern listed above.  Pt's father was excited to have pt come home. He did request for medicaid application information to be placed on the discharge packet. He did state he does have weapons and they are in a locked safe.   [name] 04/11/2020, 9:01 AM

## 2020-10-31 ENCOUNTER — Other Ambulatory Visit: Payer: Self-pay

## 2020-10-31 ENCOUNTER — Encounter (HOSPITAL_COMMUNITY): Payer: Self-pay | Admitting: Physician Assistant

## 2020-10-31 ENCOUNTER — Telehealth (INDEPENDENT_AMBULATORY_CARE_PROVIDER_SITE_OTHER): Payer: No Payment, Other | Admitting: Physician Assistant

## 2020-10-31 DIAGNOSIS — F332 Major depressive disorder, recurrent severe without psychotic features: Secondary | ICD-10-CM

## 2020-10-31 MED ORDER — VENLAFAXINE HCL ER 150 MG PO CP24: 150.0000 mg | ORAL_CAPSULE | Freq: Every day | ORAL | 1 refills | Status: DC

## 2020-10-31 NOTE — Progress Notes (Addendum)
Psychiatric Initial Adult Assessment  Virtual Visit via Telephone Note  I connected with [name] on 10/31/2020 at  9:30 AM EDT by telephone and verified that I am speaking with the correct person using two identifiers.  Location: Patient: Home Provider: Office   I discussed the limitations, risks, security and privacy concerns of performing an evaluation and management service by telephone and the availability of in person appointments. I also discussed with the patient that there may be a patient responsible charge related to this service. The patient expressed understanding and agreed to proceed.  Follow Up Instructions:  I discussed the assessment and treatment plan with the patient. The patient was provided an opportunity to ask questions and all were answered. The patient agreed with the plan and demonstrated an understanding of the instructions.   The patient was advised to call back or seek an in-person evaluation if the symptoms worsen or if the condition fails to improve as anticipated.  I provided 45 minutes of non-face-to-face time during this encounter.  Meta Hatchet, PA   Patient Identification: [name] MRN:  [id number] Date of Evaluation:  10/31/2020 Referral Source: Vesta Mixer Chief Complaint:  "I need a refill ob my Effexor Visit Diagnosis: No diagnosis found.  History of Present Illness:  24 year old female seen today for initial psychiatric evaluation. She was referred to outpatient psychiatry by [facility] for for medication management. She was presently admitted to the [facility] on 04/11-0/11/2020 for suicidal ideation. She is currently manage on 75 mg daily.  Today she is pleasant, cooperative, and engaged in conversation. She informed Clinical research associate that she has been feeling more anxious and depressed. She noted that she missed some doses due to missed does and the seasonal change. She noted that two weeks a go she cut her leg with a blade to relieve stress. Today  she endorses passive SI however noted that she does not want to harm herself. She denies SI/HI/VAH  Or paranoia.   Patient Infumed provider that she has nightmare about being abandoned individuals in her life being mean to her. At this time patient does not endorse trauma.   She is agreeable to increase Effexor 75 mg to 150 mg daily to help manage depression and anxiety. .She has an outside therapist that she will follow up with for counseling. No other concerns noted at this time.   Associated Signs/Symptoms: Depression Symptoms:  depressed mood, anhedonia, psychomotor agitation, fatigue, feelings of worthlessness/guilt, difficulty concentrating, hopelessness, suicidal thoughts without plan, anxiety, panic attacks, loss of energy/fatigue, disturbed sleep, (Hypo) Manic Symptoms:  Denies Anxiety Symptoms:  Excessive Worry, Psychotic Symptoms:  Denies PTSD Symptoms: NA  Past Psychiatric History: Depression  Previous Psychotropic Medications: Has only tried effexor  Substance Abuse History in the last 12 months:  No.  Consequences of Substance Abuse: NA  Past Medical History:  Past Medical History:  Diagnosis Date  . Anxiety   . Depression   . Medical history non-contributory     Past Surgical History:  Procedure Laterality Date  . TYMPANOSTOMY TUBE PLACEMENT      Family Psychiatric History: Denies  Family History: No family history on file.  Social History:   Social History   Socioeconomic History  . Marital status: Single    Spouse name: Not on file  . Number of children: Not on file  . Years of education: Not on file  . Highest education level: Not on file  Occupational History  . Not on file  Tobacco  Use  . Smoking status: Never Smoker  . Smokeless tobacco: Never Used  Vaping Use  . Vaping Use: Never used  Substance and Sexual Activity  . Alcohol use: No  . Drug use: No  . Sexual activity: Never  Other Topics Concern  . Not on file  Social  History Narrative  . Not on file   Social Determinants of Health   Financial Resource Strain:   . Difficulty of Paying Living Expenses: Not on file  Food Insecurity:   . Worried About Programme researcher, broadcasting/film/video in the Last Year: Not on file  . Ran Out of Food in the Last Year: Not on file  Transportation Needs:   . Lack of Transportation (Medical): Not on file  . Lack of Transportation (Non-Medical): Not on file  Physical Activity:   . Days of Exercise per Week: Not on file  . Minutes of Exercise per Session: Not on file  Stress:   . Feeling of Stress : Not on file  Social Connections:   . Frequency of Communication with Friends and Family: Not on file  . Frequency of Social Gatherings with Friends and Family: Not on file  . Attends Religious Services: Not on file  . Active Member of Clubs or Organizations: Not on file  . Attends Banker Meetings: Not on file  . Marital Status: Not on file    Additional Social History: Denies  Allergies:  No Known Allergies  Metabolic Disorder Labs: Lab Results  Component Value Date   HGBA1C 5.3 03/25/2015   MPG 105 03/25/2015   No results found for: PROLACTIN No results found for: CHOL, TRIG, HDL, CHOLHDL, VLDL, LDLCALC Lab Results  Component Value Date   TSH 1.864 04/08/2020    Therapeutic Level Labs: No results found for: LITHIUM No results found for: CBMZ No results found for: VALPROATE  Current Medications: Current Outpatient Medications  Medication Sig Dispense Refill  . traZODone (DESYREL) 50 MG tablet Take 1 tablet (50 mg total) by mouth at bedtime as needed for sleep. 30 tablet 0  . venlafaxine XR (EFFEXOR-XR) 75 MG 24 hr capsule Take 1 capsule (75 mg total) by mouth daily with breakfast. For depression 30 capsule 0   No current facility-administered medications for this visit.    Musculoskeletal: Strength & Muscle Tone: Unable to assess due to telehealth visit Gait & Station: Unable to assess due to  telehealth visit Patient leans: N/A  Psychiatric Specialty Exam: Review of Systems  There were no vitals taken for this visit.There is no height or weight on file to calculate BMI.  General Appearance: Unable to assess due to telehealth visit  Eye Contact:  Unable to assess due to telehealth visit  Speech:  Clear and Coherent and Normal Rate  Volume:  Normal  Mood:  Anxious and Depressed  Affect:  Appropriate  Thought Process:  Coherent, Goal Directed and Linear  Orientation:  Full (Time, Place, and Person)  Thought Content:  WDL and Logical  Suicidal Thoughts:  Yes.  without intent/plan  Homicidal Thoughts:  No  Memory:  Immediate;   Good Recent;   Good Remote;   Good  Judgement:  Good  Insight:  Good  Psychomotor Activity:  Normal  Concentration:  Concentration: Good and Attention Span: Good  Recall:  Good  Fund of Knowledge:Good  Language: Good  Akathisia:  No  Handed:  Right  AIMS (if indicated):  Not done  Assets:  Communication Skills Desire for Improvement Financial Resources/Insurance Housing  Social Support  ADL's:  Intact  Cognition: WNL  Sleep:  Good   Screenings: AIMS     [facility]  AIMS Total Score 0 0    AUDIT     [facility]) [facility]  Alcohol Use Disorder Identification Test Final Score (AUDIT) 0 0      Assessment and Plan: Patient endorses symptoms of anxiety and depression. She is agreeable to increase Effexor 75 mg to 150 mg to help manage depression and anxiety  1. Major depressive disorder, recurrent severe without psychotic features (HCC)  Increased- venlafaxine XR (EFFEXOR-XR) 150 MG 24 hr capsule; Take 1 capsule (150 mg total) by  mouth daily.  Dispense: 30 capsule; Refill: 1   Follow up in 6 weeks. Meta Hatchet, PA 11/4/20218:18 AM

## 2020-11-05 ENCOUNTER — Telehealth (HOSPITAL_COMMUNITY): Payer: Federal, State, Local not specified - Other

## 2020-12-12 ENCOUNTER — Other Ambulatory Visit: Payer: Self-pay

## 2020-12-12 ENCOUNTER — Telehealth (HOSPITAL_COMMUNITY): Payer: No Payment, Other | Admitting: Physician Assistant

## 2021-01-07 ENCOUNTER — Telehealth (INDEPENDENT_AMBULATORY_CARE_PROVIDER_SITE_OTHER): Payer: No Payment, Other | Admitting: Physician Assistant

## 2021-01-07 ENCOUNTER — Encounter (HOSPITAL_COMMUNITY): Payer: Self-pay | Admitting: Physician Assistant

## 2021-01-07 ENCOUNTER — Other Ambulatory Visit: Payer: Self-pay

## 2021-01-07 DIAGNOSIS — F332 Major depressive disorder, recurrent severe without psychotic features: Secondary | ICD-10-CM

## 2021-01-07 MED ORDER — VENLAFAXINE HCL ER 150 MG PO CP24: 150.0000 mg | ORAL_CAPSULE | Freq: Every day | ORAL | 1 refills | Status: DC

## 2021-01-07 NOTE — Progress Notes (Signed)
BH MD/PA/NP OP Progress Note  Virtual Visit via Telephone Note  I connected with [name] on 01/07/21 at  4:30 PM EST by telephone and verified that I am speaking with the correct person using two identifiers.  Location: Patient: Secure and private location at work Provider: Clinic   I discussed the limitations, risks, security and privacy concerns of performing an evaluation and management service by telephone and the availability of in person appointments. I also discussed with the patient that there may be a patient responsible charge related to this service. The patient expressed understanding and agreed to proceed.  Follow Up Instructions:  I discussed the assessment and treatment plan with the patient. The patient was provided an opportunity to ask questions and all were answered. The patient agreed with the plan and demonstrated an understanding of the instructions.   The patient was advised to call back or seek an in-person evaluation if the symptoms worsen or if the condition fails to improve as anticipated.  I provided 22 minutes of non-face-to-face time during this encounter.  Meta Hatchet, PA    01/07/2021 6:32 PM [name]  MRN:  [id number]  Chief Complaint: Follow-up and medication management  HPI:  [name]. Daley is a 25 year old female with a past psychiatric history significant for major depressive disorder who presents to [facility] for follow-up and medication management.  Patient is currently being managed on the following medications:  Effexor 150 mg 24-hour tablet daily Trazodone 50 mg at bedtime as needed  Patient reports that things have been going real well and that her Effexor has been managing her depression well.  Patient reports that she has been able to function considerably well even during stressful situations.  Patient states that she has not needed to take trazodone as often to help manage  her sleep.  Patient denies any depressive symptoms at this time.  Patient denies the need for dosage adjustments and is requesting a refill on her Effexor.  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  Patient is currently going to [employer] and studying to receive her associates in science.  Patient is pleasant, calm, cooperative, and engaged in conversation during the encounter.  Patient reports having a good mood and states that the only stressors she has at this time are school and finances.  Patient denies suicidal or homicidal ideations.  She further denies auditory or visual hallucinations.  Patient reports receiving roughly 9 hours of sleep a night and has had no issues with her sleep except for the occasional oversleeping. Patient endorses good appetite and eats roughly 1-2 meals a day.  Patient denies alcohol consumption, tobacco use, and illicit drug use.  Visit Diagnosis:    ICD-10-CM   1. Major depressive disorder, recurrent severe without psychotic features (HCC)  F33.2 venlafaxine XR (EFFEXOR-XR) 150 MG 24 hr capsule    Past Psychiatric History:  Major Depressive Disorder  Past Medical History:  Past Medical History:  Diagnosis Date  . Anxiety   . Depression   . Medical history non-contributory     Past Surgical History:  Procedure Laterality Date  . TYMPANOSTOMY TUBE PLACEMENT      Family Psychiatric History:  None  Family History: History reviewed. No pertinent family history.  Social History:  Social History   Socioeconomic History  . Marital status: Single    Spouse name: Not on file  . Number of  children: Not on file  . Years of education: Not on file  . Highest education level: Not on file  Occupational History  . Not on file  Tobacco Use  . Smoking status: Never Smoker  . Smokeless tobacco: Never Used  Vaping Use  . Vaping Use: Never used  Substance and Sexual Activity  . Alcohol use: No  .  Drug use: No  . Sexual activity: Never  Other Topics Concern  . Not on file  Social History Narrative  . Not on file   Social Determinants of Health   Financial Resource Strain: Not on file  Food Insecurity: Not on file  Transportation Needs: Not on file  Physical Activity: Not on file  Stress: Not on file  Social Connections: Not on file    Allergies: No Known Allergies  Metabolic Disorder Labs: Lab Results  Component Value Date   HGBA1C 5.3 03/25/2015   MPG 105 03/25/2015   No results found for: PROLACTIN No results found for: CHOL, TRIG, HDL, CHOLHDL, VLDL, LDLCALC Lab Results  Component Value Date   TSH 1.864 04/08/2020   TSH 4.100 03/25/2015    Therapeutic Level Labs: No results found for: LITHIUM No results found for: VALPROATE No components found for:  CBMZ  Current Medications: Current Outpatient Medications  Medication Sig Dispense Refill  . traZODone (DESYREL) 50 MG tablet Take 1 tablet (50 mg total) by mouth at bedtime as needed for sleep. 30 tablet 0  . venlafaxine XR (EFFEXOR-XR) 150 MG 24 hr capsule Take 1 capsule (150 mg total) by mouth daily. 30 capsule 1   No current facility-administered medications for this visit.     Musculoskeletal: Strength & Muscle Tone: Unable to assess due to telemedicine visit Gait & Station: Unable to assess due to telemedicine visit Patient leans: Unable to assess due to telemedicine visit  Psychiatric Specialty Exam: Review of Systems  There were no vitals taken for this visit.There is no height or weight on file to calculate BMI.  General Appearance: Unable to assess due to telemedicine visit  Eye Contact:  Unable to assess due to telemedicine visit  Speech:  Clear and Coherent and Normal Rate  Volume:  Normal  Mood:  Euthymic  Affect:  Appropriate  Thought Process:  Coherent, Goal Directed and Descriptions of Associations: Intact  Orientation:  Full (Time, Place, and Person)  Thought Content: WDL and  Logical   Suicidal Thoughts:  No  Homicidal Thoughts:  No  Memory:  Immediate;   Good Recent;   Good Remote;   Good  Judgement:  Good  Insight:  Good  Psychomotor Activity:  Normal  Concentration:  Concentration: Good and Attention Span: Good  Recall:  Good  Fund of Knowledge: Good  Language: Good  Akathisia:  NA  Handed:  Right  AIMS (if indicated): not done  Assets:  Communication Skills Desire for Improvement Financial Resources/Insurance Housing Social Support  ADL's:  Intact  Cognition: WNL  Sleep:  Good   Screenings: AIMS   Flowsheet Row [facility]) [facility]  AIMS Total Score 0 0    AUDIT   Flowsheet Row [facility]) [facility]  Alcohol Use Disorder Identification Test Final Score (AUDIT) 0 0       Assessment and Plan:   Jayley B.  Cappucci is a 25 year old female with a past psychiatric history significant for major depressive disorder who presents to [facility] for follow-up and medication management.  Patient reports that things have been going really well and that her major depressive disorder is being managed extremely well with Effexor.  Patient denies the need for dosage adjustments at this time and is requesting a refill on her Effexor medication.  Patient's medication will be prescribed to pharmacy of choice.  1. Major depressive disorder, recurrent severe without psychotic features (HCC)  - venlafaxine XR (EFFEXOR-XR) 150 MG 24 hr capsule; Take 1 capsule (150 mg total) by mouth daily.  Dispense: 30 capsule; Refill: 1  Patient to follow-up in 6 weeks  Meta Hatchet, PA 01/07/2021, 6:32 PM

## 2021-02-20 ENCOUNTER — Encounter (HOSPITAL_COMMUNITY): Payer: Self-pay | Admitting: Physician Assistant

## 2021-02-20 ENCOUNTER — Telehealth (INDEPENDENT_AMBULATORY_CARE_PROVIDER_SITE_OTHER): Payer: No Payment, Other | Admitting: Physician Assistant

## 2021-02-20 ENCOUNTER — Other Ambulatory Visit: Payer: Self-pay

## 2021-02-20 DIAGNOSIS — F330 Major depressive disorder, recurrent, mild: Secondary | ICD-10-CM | POA: Diagnosis not present

## 2021-02-20 MED ORDER — VENLAFAXINE HCL ER 150 MG PO CP24: 150.0000 mg | ORAL_CAPSULE | Freq: Every day | ORAL | 2 refills | Status: DC

## 2021-02-20 NOTE — Progress Notes (Signed)
BH MD/PA/NP OP Progress Note  Virtual Visit via Telephone Note  I connected with [name] on 02/20/21 at  4:00 PM EST by telephone and verified that I am speaking with the correct person using two identifiers.  Location: Patient: Home Provider: Clinic   I discussed the limitations, risks, security and privacy concerns of performing an evaluation and management service by telephone and the availability of in person appointments. I also discussed with the patient that there may be a patient responsible charge related to this service. The patient expressed understanding and agreed to proceed.  Follow Up Instructions:    I discussed the assessment and treatment plan with the patient. The patient was provided an opportunity to ask questions and all were answered. The patient agreed with the plan and demonstrated an understanding of the instructions.   The patient was advised to call back or seek an in-person evaluation if the symptoms worsen or if the condition fails to improve as anticipated.  I provided 15 minutes of non-face-to-face time during this encounter.  Meta Hatchet, PA   02/20/2021 10:11 PM [name]  MRN:  [id number]  Chief Complaint: Follow up and medication management  HPI:   [name] is a 25 year old female with a past psychiatric history significant for major depressive disorder who presents to [facility] for follow-up and medication management.  Patient is currently being managed on the following medications:  Effexor 150 mg 24-hour tablet daily Trazodone 50 mg at bedtime  Patient's main concern involves the amount of sleep that she has been receiving recently.  Patient reports that she has been sleeping too much and attributes her hypersomnia to her trazodone use.  Other than her sleep concerns, patient reports no issues or concerns regarding her Effexor medication.  Patient denies the need for dosage  adjustments at this time and is requesting a medication refill for her Effexor.  Patient denies any new life-changing events and states that her main stressor is school.  Patient is pleasant, calm, cooperative, and fully engaged in conversation during the encounter.  Patient reports good mood.  Patient denies suicidal or homicidal ideations.  She further denies auditory or visual hallucinations.  Patient states that she receives 12 hours of sleep on average even without the use of her trazodone.  Patient endorses good appetite and eats roughly 1-2 meals per day.  Patient endorses alcohol consumption occasionally.  Patient denies tobacco use and illicit drug use.  Visit Diagnosis:    ICD-10-CM   1. Mild episode of recurrent major depressive disorder (HCC)  F33.0 venlafaxine XR (EFFEXOR-XR) 150 MG 24 hr capsule    Past Psychiatric History: Major depressive disorder  Past Medical History:  Past Medical History:  Diagnosis Date  . Anxiety   . Depression   . Medical history non-contributory     Past Surgical History:  Procedure Laterality Date  . TYMPANOSTOMY TUBE PLACEMENT      Family Psychiatric History:  None  Family History: No family history on file.  Social History:  Social History   Socioeconomic History  . Marital status: Single    Spouse Baird: Not on file  . Number of children: Not on file  . Years of education: Not on file  . Highest education level: Not on file  Occupational History  . Not on file  Tobacco Use  . Smoking status: Never Smoker  . Smokeless tobacco: Never Used  Vaping Use  . Vaping Use: Never  used  Substance and Sexual Activity  . Alcohol use: No  . Drug use: No  . Sexual activity: Never  Other Topics Concern  . Not on file  Social History Narrative  . Not on file   Social Determinants of Health   Financial Resource Strain: Not on file  Food Insecurity: Not on file  Transportation Needs: Not on file  Physical Activity: Not on file   Stress: Not on file  Social Connections: Not on file    Allergies: No Known Allergies  Metabolic Disorder Labs: Lab Results  Component Value Date   HGBA1C 5.3 03/25/2015   MPG 105 03/25/2015   No results found for: PROLACTIN No results found for: CHOL, TRIG, HDL, CHOLHDL, VLDL, LDLCALC Lab Results  Component Value Date   TSH 1.864 04/08/2020   TSH 4.100 03/25/2015    Therapeutic Level Labs: No results found for: LITHIUM No results found for: VALPROATE No components found for:  CBMZ  Current Medications: Current Outpatient Medications  Medication Sig Dispense Refill  . venlafaxine XR (EFFEXOR-XR) 150 MG 24 hr capsule Take 1 capsule (150 mg total) by mouth daily. 30 capsule 2   No current facility-administered medications for this visit.     Musculoskeletal: Strength & Muscle Tone: Unable to assess due to telemedicine visit Gait & Station: Unable to assess due to telemedicine visit Patient leans: Unable to assess due to telemedicine visit  Psychiatric Specialty Exam: Review of Systems  Psychiatric/Behavioral: Positive for sleep disturbance. Negative for decreased concentration, dysphoric mood, hallucinations, self-injury and suicidal ideas. The patient is not nervous/anxious and is not hyperactive.     There were no vitals taken for this visit.There is no height or weight on file to calculate BMI.  General Appearance: Unable to assess due to telemedicine visit  Eye Contact:  Unable to assess due to telemedicine visit  Speech:  Clear and Coherent and Normal Rate  Volume:  Normal  Mood:  Euthymic  Affect:  Appropriate  Thought Process:  Coherent, Goal Directed and Descriptions of Associations: Intact  Orientation:  Full (Time, Place, and Person)  Thought Content: WDL   Suicidal Thoughts:  No  Homicidal Thoughts:  No  Memory:  Immediate;   Good Recent;   Good Remote;   Good  Judgement:  Good  Insight:  Good  Psychomotor Activity:  Normal  Concentration:   Concentration: Good and Attention Span: Good  Recall:  Good  Fund of Knowledge: Good  Language: Good  Akathisia:  NA  Handed:  Right  AIMS (if indicated): not done  Assets:  Communication Skills Desire for Improvement Financial Resources/Insurance Housing Social Support  ADL's:  Intact  Cognition: WNL  Sleep:  Good   Screenings: AIMS   Flowsheet Row [facility] Ad[facility]  AIMS Total Score 0 0    AUDIT   Flowsheet Row [facility]) [facility]  Alcohol Use Disorder Identification Test Final Score (AUDIT) 0 0    GAD-7   Flowsheet Row Video Visit from 02/20/2021 in [facility]  Total GAD-7 Score 0    PHQ2-9   Flowsheet Row Video Visit from 02/20/2021 in [facility]  PHQ-2 Total Score 1    Flowsheet Row Video Visit from 02/20/2021 in [facility]  [facility]) [facility] ED from 04/07/2020 in [facility]  C-SSRS RISK CATEGORY Moderate Risk High Risk High Risk       Assessment and Plan:   Shai B. Oliveri is a 25 year old female with a past psychiatric history significant for major depressive disorder who presents to [facility] for follow-up and medication management.  Patient's main concern involves the amount of sleep she has been receiving.  Patient endorses receiving roughly 12 hours of sleep a night.  Patient attributes her hypersomnia to her trazodone use.  Patient was recommended to discontinue taking trazodone to see if her sleep improves.  Patient was agreeable  to recommendation.  Patient has no other issues or concerns regarding her medication regimen.  Patient's medication will be e-prescribed to pharmacy of choice  1. Mild episode of recurrent major depressive disorder (HCC)  - venlafaxine XR (EFFEXOR-XR) 150 MG 24 hr capsule; Take 1 capsule (150 mg total) by mouth daily.  Dispense: 30 capsule; Refill: 2  Patient to follow up in 2 months  Meta Hatchet, PA 02/20/2021, 10:11 PM

## 2021-03-06 ENCOUNTER — Other Ambulatory Visit: Payer: Self-pay

## 2021-03-06 ENCOUNTER — Emergency Department (HOSPITAL_COMMUNITY): Payer: Medicaid Other

## 2021-03-06 ENCOUNTER — Encounter (HOSPITAL_COMMUNITY): Payer: Self-pay | Admitting: Student

## 2021-03-06 ENCOUNTER — Emergency Department (HOSPITAL_COMMUNITY)
Admission: EM | Admit: 2021-03-06 | Discharge: 2021-03-06 | Disposition: A | Discharge status: Home / Self Care | Payer: Medicaid Other | Attending: Emergency Medicine | Admitting: Emergency Medicine

## 2021-03-06 DIAGNOSIS — R630 Anorexia: Secondary | ICD-10-CM | POA: Insufficient documentation

## 2021-03-06 DIAGNOSIS — R112 Nausea with vomiting, unspecified: Secondary | ICD-10-CM | POA: Insufficient documentation

## 2021-03-06 DIAGNOSIS — R197 Diarrhea, unspecified: Secondary | ICD-10-CM | POA: Insufficient documentation

## 2021-03-06 DIAGNOSIS — R1084 Generalized abdominal pain: Secondary | ICD-10-CM | POA: Insufficient documentation

## 2021-03-06 DIAGNOSIS — Z20822 Contact with and (suspected) exposure to covid-19: Secondary | ICD-10-CM | POA: Insufficient documentation

## 2021-03-06 DIAGNOSIS — R0602 Shortness of breath: Secondary | ICD-10-CM | POA: Insufficient documentation

## 2021-03-06 DIAGNOSIS — R1110 Vomiting, unspecified: Secondary | ICD-10-CM

## 2021-03-06 LAB — CBC
HCT: 43.1 % (ref 36.0–46.0)
Hemoglobin: 15.5 g/dL — ABNORMAL HIGH (ref 12.0–15.0)
MCH: 30.2 pg (ref 26.0–34.0)
MCHC: 36 g/dL (ref 30.0–36.0)
MCV: 84 fL (ref 80.0–100.0)
Platelets: 343 10*3/uL (ref 150–400)
RBC: 5.13 MIL/uL — ABNORMAL HIGH (ref 3.87–5.11)
RDW: 11.8 % (ref 11.5–15.5)
WBC: 16.8 10*3/uL — ABNORMAL HIGH (ref 4.0–10.5)
nRBC: 0 % (ref 0.0–0.2)

## 2021-03-06 LAB — BASIC METABOLIC PANEL
Anion gap: 8 (ref 5–15)
BUN: 11 mg/dL (ref 6–20)
CO2: 22 mmol/L (ref 22–32)
Calcium: 9.3 mg/dL (ref 8.9–10.3)
Chloride: 108 mmol/L (ref 98–111)
Creatinine, Ser: 0.82 mg/dL (ref 0.44–1.00)
GFR, Estimated: >60 mL/min (ref >60)
Glucose, Bld: 158 mg/dL — ABNORMAL HIGH (ref 70–99)
Potassium: 3.5 mmol/L (ref 3.5–5.1)
Sodium: 138 mmol/L (ref 135–145)

## 2021-03-06 LAB — URINALYSIS, ROUTINE W REFLEX MICROSCOPIC
Bilirubin Urine: NEGATIVE
Glucose, UA: 50 mg/dL — AB
Hgb urine dipstick: NEGATIVE
Ketones, ur: 20 mg/dL — AB
Leukocytes,Ua: NEGATIVE
Nitrite: NEGATIVE
Protein, ur: NEGATIVE mg/dL
Specific Gravity, Urine: 1.023 (ref 1.005–1.030)
pH: 8 (ref 5.0–8.0)

## 2021-03-06 LAB — COMPREHENSIVE METABOLIC PANEL
ALT: 19 U/L (ref 0–44)
AST: 22 U/L (ref 15–41)
Albumin: 4.7 g/dL (ref 3.5–5.0)
Alkaline Phosphatase: 42 U/L (ref 38–126)
Anion gap: 16 — ABNORMAL HIGH (ref 5–15)
BUN: 15 mg/dL (ref 6–20)
CO2: 16 mmol/L — ABNORMAL LOW (ref 22–32)
Calcium: 9.9 mg/dL (ref 8.9–10.3)
Chloride: 103 mmol/L (ref 98–111)
Creatinine, Ser: 0.84 mg/dL (ref 0.44–1.00)
GFR, Estimated: >60 mL/min (ref >60)
Glucose, Bld: 211 mg/dL — ABNORMAL HIGH (ref 70–99)
Potassium: 3.6 mmol/L (ref 3.5–5.1)
Sodium: 135 mmol/L (ref 135–145)
Total Bilirubin: 0.9 mg/dL (ref 0.3–1.2)
Total Protein: 8.3 g/dL — ABNORMAL HIGH (ref 6.5–8.1)

## 2021-03-06 LAB — TROPONIN I (HIGH SENSITIVITY): Troponin I (High Sensitivity): <2 ng/L (ref <18)

## 2021-03-06 LAB — BLOOD GAS, VENOUS
Acid-base deficit: 0.3 mmol/L (ref 0.0–2.0)
Bicarbonate: 22.5 mmol/L (ref 20.0–28.0)
FIO2: 21
O2 Saturation: 71.5 %
Patient temperature: 98.6
pCO2, Ven: 33 mmHg — ABNORMAL LOW (ref 44.0–60.0)
pH, Ven: 7.449 — ABNORMAL HIGH (ref 7.250–7.430)
pO2, Ven: 36.7 mmHg (ref 32.0–45.0)

## 2021-03-06 LAB — SARS CORONAVIRUS 2 (TAT 6-24 HRS): SARS Coronavirus 2: NEGATIVE

## 2021-03-06 LAB — I-STAT BETA HCG BLOOD, ED (MC, WL, AP ONLY): I-stat hCG, quantitative: <5 m[IU]/mL (ref <5)

## 2021-03-06 LAB — HEMOGLOBIN A1C
Hgb A1c MFr Bld: 4.7 % — ABNORMAL LOW (ref 4.8–5.6)
Mean Plasma Glucose: 88.19 mg/dL

## 2021-03-06 LAB — D-DIMER, QUANTITATIVE: D-Dimer, Quant: 0.47 ug/mL-FEU (ref 0.00–0.50)

## 2021-03-06 LAB — BETA-HYDROXYBUTYRIC ACID: Beta-Hydroxybutyric Acid: 0.1 mmol/L (ref 0.05–0.27)

## 2021-03-06 LAB — LIPASE, BLOOD: Lipase: 24 U/L (ref 11–51)

## 2021-03-06 MED ORDER — LACTATED RINGERS IV BOLUS
1000.0000 mL | Freq: Once | INTRAVENOUS | Status: AC
  Administered 2021-03-06: 1000 mL via INTRAVENOUS

## 2021-03-06 MED ORDER — ONDANSETRON HCL 4 MG/2ML IJ SOLN
4.0000 mg | Freq: Once | INTRAMUSCULAR | Status: AC
  Administered 2021-03-06: 4 mg via INTRAVENOUS
  Filled 2021-03-06: qty 2

## 2021-03-06 MED ORDER — HALOPERIDOL LACTATE 5 MG/ML IJ SOLN
2.0000 mg | Freq: Once | INTRAMUSCULAR | Status: AC
  Administered 2021-03-06: 2 mg via INTRAVENOUS
  Filled 2021-03-06: qty 1

## 2021-03-06 MED ORDER — ONDANSETRON 4 MG PO TBDP: 4.0000 mg | ORAL_TABLET | Freq: Three times a day (TID) | ORAL | 0 refills | Status: DC | PRN

## 2021-03-06 MED ORDER — LORAZEPAM 1 MG PO TABS
1.0000 mg | ORAL_TABLET | Freq: Once | ORAL | Status: AC
  Administered 2021-03-06: 1 mg via ORAL
  Filled 2021-03-06: qty 1

## 2021-03-06 NOTE — ED Triage Notes (Signed)
Patient BIB [facility] for covid like symptoms.  Patient had a test at urgent care yesterday and is still awaiting results.  Patient having SOB, body aches, and cough.  Patient hyperventilating.    Vitals were 150/96 110-HR 20-RR 100% room air 183-CBG 96.5-temp

## 2021-03-06 NOTE — ED Provider Notes (Signed)
[facility]-EMERGENCY DEPT Provider Note   CSN: [id number] Arrival date & time: 03/06/21  9833     History Chief Complaint  Patient presents with  . Shortness of Breath  . Cough  . Chest Pain  . Emesis  . Diarrhea    [name] is a 25 y.o. female.  The history is provided by the patient.  Abdominal Pain Pain location:  Generalized Pain quality: aching   Pain radiates to:  Does not radiate Pain severity:  Moderate Onset quality:  Gradual Duration:  3 days Timing:  Constant Progression:  Worsening Chronicity:  New Context: recent illness (concerned she has covid)   Relieved by:  Nothing Worsened by:  Nothing Ineffective treatments:  None tried Associated symptoms: anorexia, diarrhea, nausea, shortness of breath and vomiting   Associated symptoms: no chest pain, no chills, no cough, no dysuria and no fever        Past Medical History:  Diagnosis Date  . Anxiety   . Depression   . Medical history non-contributory     Patient Active Problem List   Diagnosis Date Noted  . Cannabis use disorder, severe, dependence (HCC) 04/08/2020  . MDD (major depressive disorder), severe (HCC) 04/08/2020  . Major depression, recurrent (HCC) 04/08/2020  . Major depressive disorder, recurrent severe without psychotic features (HCC) 03/25/2015    Past Surgical History:  Procedure Laterality Date  . TYMPANOSTOMY TUBE PLACEMENT       OB History   No obstetric history on file.     History reviewed. No pertinent family history.  Social History   Tobacco Use  . Smoking status: Never Smoker  . Smokeless tobacco: Never Used  Vaping Use  . Vaping Use: Never used  Substance Use Topics  . Alcohol use: No  . Drug use: No    Home Medications Prior to Admission medications   Medication Sig Start Date End Date Taking? Authorizing Provider  ondansetron (ZOFRAN ODT) 4 MG disintegrating tablet Take 1 tablet (4 mg total) by mouth every 8 (eight) hours  as needed for up to 10 doses for nausea or vomiting. 03/06/21  Yes Sabino Donovan, MD  traZODone (DESYREL) 50 MG tablet Take 25-50 mg by mouth at bedtime as needed for sleep. 05/20/20  Yes [provider]  venlafaxine XR (EFFEXOR-XR) 150 MG 24 hr capsule Take 1 capsule (150 mg total) by mouth daily. 02/20/21 02/20/22 Yes Nwoko, Tommas Olp, PA    Allergies    Patient has no known allergies.  Review of Systems   Review of Systems  Constitutional: Negative for chills and fever.  HENT: Negative for congestion and rhinorrhea.   Respiratory: Positive for chest tightness and shortness of breath. Negative for cough.   Cardiovascular: Negative for chest pain and palpitations.  Gastrointestinal: Positive for abdominal pain, anorexia, diarrhea, nausea and vomiting.  Genitourinary: Negative for difficulty urinating and dysuria.  Musculoskeletal: Negative for arthralgias and back pain.  Skin: Negative for rash and wound.  Neurological: Negative for light-headedness and headaches.  Psychiatric/Behavioral: The patient is nervous/anxious.     Physical Exam Updated Vital Signs BP 128/73   Pulse (!) 105   Temp 97.7 F (36.5 C) (Oral)   Resp 18   Ht 5\' 3"  (1.6 m)   Wt 72 kg   SpO2 99%   BMI 28.12 kg/m   Physical Exam Vitals and nursing note reviewed. Exam conducted with a chaperone present.  Constitutional:      General: She is not in  acute distress.    Appearance: Normal appearance.  HENT:     Head: Normocephalic and atraumatic.     Nose: No rhinorrhea.     Mouth/Throat:     Mouth: Mucous membranes are moist.  Eyes:     General:        Right eye: No discharge.        Left eye: No discharge.     Conjunctiva/sclera: Conjunctivae normal.  Cardiovascular:     Rate and Rhythm: Normal rate and regular rhythm.  Pulmonary:     Effort: Pulmonary effort is normal. No respiratory distress.     Breath sounds: No stridor.  Abdominal:     General: Abdomen is flat. There is no distension.      Palpations: Abdomen is soft.     Tenderness: There is abdominal tenderness (generalized). There is no guarding or rebound.  Musculoskeletal:        General: No tenderness or signs of injury.  Skin:    General: Skin is warm and dry.     Capillary Refill: Capillary refill takes less than 2 seconds.  Neurological:     General: No focal deficit present.     Mental Status: She is alert. Mental status is at baseline.     Motor: No weakness.  Psychiatric:        Mood and Affect: Mood normal.        Behavior: Behavior normal.     ED Results / Procedures / Treatments   Labs (all labs ordered are listed, but only abnormal results are displayed) Labs Reviewed  CBC - Abnormal; Notable for the following components:      Result Value   WBC 16.8 (*)    RBC 5.13 (*)    Hemoglobin 15.5 (*)    All other components within normal limits  COMPREHENSIVE METABOLIC PANEL - Abnormal; Notable for the following components:   CO2 16 (*)    Glucose, Bld 211 (*)    Total Protein 8.3 (*)    Anion gap 16 (*)    All other components within normal limits  URINALYSIS, ROUTINE W REFLEX MICROSCOPIC - Abnormal; Notable for the following components:   APPearance HAZY (*)    Glucose, UA 50 (*)    Ketones, ur 20 (*)    All other components within normal limits  HEMOGLOBIN A1C - Abnormal; Notable for the following components:   Hgb A1c MFr Bld 4.7 (*)    All other components within normal limits  BLOOD GAS, VENOUS - Abnormal; Notable for the following components:   pH, Ven 7.449 (*)    pCO2, Ven 33.0 (*)    All other components within normal limits  BASIC METABOLIC PANEL - Abnormal; Notable for the following components:   Glucose, Bld 158 (*)    All other components within normal limits  SARS CORONAVIRUS 2 (TAT 6-24 HRS)  LIPASE, BLOOD  D-DIMER, QUANTITATIVE  BETA-HYDROXYBUTYRIC ACID  I-STAT BETA HCG BLOOD, ED ([facility], [facility], [facility] ONLY)  TROPONIN I (HIGH SENSITIVITY)    EKG None  Radiology DG Chest 1  View  Result Date: 03/06/2021 CLINICAL DATA:  Shortness of breath.  Chest pain. EXAM: CHEST  1 VIEW COMPARISON:  No prior. FINDINGS: Mediastinum and hilar structures normal. Heart size normal. No focal infiltrate. No pleural effusion or pneumothorax. IMPRESSION: No acute cardiopulmonary disease. Electronically Signed   By: Maisie Fus  Register   On: 03/06/2021 08:28    Procedures Procedures   Medications Ordered in ED Medications  lactated ringers bolus 1,000 mL (0 mLs Intravenous Stopped 03/06/21 0906)  ondansetron (ZOFRAN) injection 4 mg (4 mg Intravenous Given 03/06/21 0758)  lactated ringers bolus 1,000 mL (0 mLs Intravenous Stopped 03/06/21 1247)  LORazepam (ATIVAN) tablet 1 mg (1 mg Oral Given 03/06/21 1127)  ondansetron (ZOFRAN) injection 4 mg (4 mg Intravenous Given 03/06/21 1243)  haloperidol lactate (HALDOL) injection 2 mg (2 mg Intravenous Given 03/06/21 1304)    ED Course  I have reviewed the triage vital signs and the nursing notes.  Pertinent labs & imaging results that were available during my care of the patient were reviewed by me and considered in my medical decision making (see chart for details).    MDM Rules/Calculators/A&P                          Patient has flulike symptoms, reports that she is concerned she has Covid.  No known exposure.  No vaccine.  Nausea vomiting diarrhea body aches.  Is also having chest tightness shortness of breath.  Has tachycardia so low risk Wells plus dimer will be done.  Will get cardiac biomarkers EKG chest x-ray.  We will get screening labs for organ dysfunction or electrolyte derangement.  Will give IV fluids antiemetics.  Initial labs showed mild hyperglycemia with a low bicarb and an anion gap.  Initial concern for possible new onset diabetes and DKA however pH is 7.4.  I consulted inpatient medicine for possible further work-up they recommended hemoglobin A1c blood his beta hydroxybutyrate and repeat labs.  Repeat labs are much improved  with no acidosis improved anion gap.  Glucose is improved.  A1c is low.  Pain hydroxybutyrate is within normal limits.  Likelihood is this is just stress-induced hyperglycemia in the setting of hyperemesis cannabinoid versus gastroenteritis.  Haldol is given and has complete resolution of symptoms.  Patient safe for discharge home Zofran provided return precautions discussed.  Final Clinical Impression(s) / ED Diagnoses Final diagnoses:  Vomiting in adult  Diarrhea in adult patient    Rx / DC Orders ED Discharge Orders         Ordered    ondansetron (ZOFRAN ODT) 4 MG disintegrating tablet  Every 8 hours PRN        03/06/21 1401           Sabino Donovan, MD 03/06/21 [phone]

## 2021-04-17 ENCOUNTER — Encounter (HOSPITAL_COMMUNITY): Payer: Self-pay | Admitting: Physician Assistant

## 2021-04-17 ENCOUNTER — Other Ambulatory Visit: Payer: Self-pay

## 2021-04-17 ENCOUNTER — Telehealth (INDEPENDENT_AMBULATORY_CARE_PROVIDER_SITE_OTHER): Payer: No Payment, Other | Admitting: Physician Assistant

## 2021-04-17 DIAGNOSIS — F330 Major depressive disorder, recurrent, mild: Secondary | ICD-10-CM | POA: Insufficient documentation

## 2021-04-17 MED ORDER — VENLAFAXINE HCL ER 150 MG PO CP24: 150.0000 mg | ORAL_CAPSULE | Freq: Every day | ORAL | 2 refills | Status: DC

## 2021-04-17 NOTE — Progress Notes (Signed)
[facility] MD/PA/NP OP Progress Note  Virtual Visit via Telephone Note  I connected with [name] on 04/17/21 at  1:00 PM EDT by telephone and verified that I am speaking with the correct person using two identifiers.  Location: Patient: Home Provider: Clinic   I discussed the limitations, risks, security and privacy concerns of performing an evaluation and management service by telephone and the availability of in person appointments. I also discussed with the patient that there may be a patient responsible charge related to this service. The patient expressed understanding and agreed to proceed.  Follow Up Instructions:  I discussed the assessment and treatment plan with the patient. The patient was provided an opportunity to ask questions and all were answered. The patient agreed with the plan and demonstrated an understanding of the instructions.   The patient was advised to call back or seek an in-person evaluation if the symptoms worsen or if the condition fails to improve as anticipated.  I provided 15 minutes of non-face-to-face time during this encounter.   Meta Hatchet, PA   04/17/2021 1:25 PM [name]  MRN:  [id number]  Chief Complaint: Follow up and medication management  HPI:   [name] is a 25 year old female with a past psychiatric history significant for major depressive disorder who presents to [facility] via virtual telephone visit for follow-up and medication management.  Patient is currently being managed on the following medications:  Effexor XR 150 mg 24-hour tablet Trazodone 50 mg at bedtime  Patient reports no issues or concerns with her current regimen of medications, however, she states that she has become less reliant on taking her trazodone and has been taking it as needed. Patient denies experiencing any depressive symptoms but states that she has recently experienced loss.  Patient reports that  her grandmother and a 52 year old friend recently passed.  Patient reports that work has been going well but she has had to take some time off in order to attend funerals.  Patient denies any other issues or concerns at this time.  Patient is calm, cooperative, and fully engaged in conversation during the encounter.  Patient reports that her mood is good.  Patient denies suicidal or homicidal ideations.  She further denies auditory or visual hallucinations and does not appear to be responding to internal/external stimuli.  Patient endorses good sleep and receives on average 8 hours of sleep each night.  Patient endorses good appetite and eats on average 2 meals per day.  Patient endorses alcohol consumption occasionally.  Patient denies tobacco use and illicit drug use.  Visit Diagnosis:    ICD-10-CM   1. Mild episode of recurrent major depressive disorder (HCC)  F33.0 venlafaxine XR (EFFEXOR-XR) 150 MG 24 hr capsule    Past Psychiatric History:  Major depressive disorder  Past Medical History:  Past Medical History:  Diagnosis Date  . Anxiety   . Depression   . Medical history non-contributory     Past Surgical History:  Procedure Laterality Date  . TYMPANOSTOMY TUBE PLACEMENT      Family Psychiatric History:  None  Family History: History reviewed. No pertinent family history.  Social History:  Social History   Socioeconomic History  . Marital status: Single    Spouse name: Not on file  . Number of children: Not on file  . Years of education: Not on file  . Highest education level: Not on file  Occupational History  . Not on file  Tobacco Use  . Smoking status: Never Smoker  . Smokeless tobacco: Never Used  Vaping Use  . Vaping Use: Never used  Substance and Sexual Activity  . Alcohol use: No  . Drug use: No  . Sexual activity: Never  Other Topics Concern  . Not on file  Social History Narrative  . Not on file   Social Determinants of Health   Financial  Resource Strain: Not on file  Food Insecurity: Not on file  Transportation Needs: Not on file  Physical Activity: Not on file  Stress: Not on file  Social Connections: Not on file    Allergies: No Known Allergies  Metabolic Disorder Labs: Lab Results  Component Value Date   HGBA1C 4.7 (L) 03/06/2021   MPG 88.19 03/06/2021   MPG 105 03/25/2015   No results found for: PROLACTIN No results found for: CHOL, TRIG, HDL, CHOLHDL, VLDL, LDLCALC Lab Results  Component Value Date   TSH 1.864 04/08/2020   TSH 4.100 03/25/2015    Therapeutic Level Labs: No results found for: LITHIUM No results found for: VALPROATE No components found for:  CBMZ  Current Medications: Current Outpatient Medications  Medication Sig Dispense Refill  . ondansetron (ZOFRAN ODT) 4 MG disintegrating tablet Take 1 tablet (4 mg total) by mouth every 8 (eight) hours as needed for up to 10 doses for nausea or vomiting. 10 tablet 0  . traZODone (DESYREL) 50 MG tablet Take 25-50 mg by mouth at bedtime as needed for sleep.    Marland Kitchen venlafaxine XR (EFFEXOR-XR) 150 MG 24 hr capsule Take 1 capsule (150 mg total) by mouth daily. 30 capsule 2   No current facility-administered medications for this visit.     Musculoskeletal: Strength & Muscle Tone: Unable to assess due to telemedicine visit Gait & Station: Unable to assess due to telemedicine visit Patient leans: Unable to assess due to telemedicine visit  Psychiatric Specialty Exam: Review of Systems  Psychiatric/Behavioral: Negative for decreased concentration, dysphoric mood, hallucinations, self-injury, sleep disturbance and suicidal ideas. The patient is not nervous/anxious and is not hyperactive.     There were no vitals taken for this visit.There is no height or weight on file to calculate BMI.  General Appearance: Unable to assess due to telemedicine visit  Eye Contact:  Unable to assess due to telemedicine visit  Speech:  Clear and Coherent and Normal Rate   Volume:  Normal  Mood:  Euthymic  Affect:  Appropriate  Thought Process:  Coherent and Descriptions of Associations: Intact  Orientation:  Full (Time, Place, and Person)  Thought Content: WDL   Suicidal Thoughts:  No  Homicidal Thoughts:  No  Memory:  Immediate;   Good Recent;   Good Remote;   Good  Judgement:  Good  Insight:  Good  Psychomotor Activity:  Normal  Concentration:  Concentration: Good and Attention Span: Good  Recall:  Good  Fund of Knowledge: Good  Language: Good  Akathisia:  NA  Handed:  Right  AIMS (if indicated): not done  Assets:  Communication Skills Desire for Improvement Housing Social Support Vocational/Educational  ADL's:  Intact  Cognition: WNL  Sleep:  Good   Screenings: AIMS   Flowsheet Row [facility]  AIMS Total Score 0 0    AUDIT   Flowsheet Row [facility]) [facility] ([facility]  Alcohol Use Disorder Identification Test Final Score (AUDIT) 0 0    GAD-7   Flowsheet Row Video Visit from [facility]/17/2021 in [facility] Video Visit from 02/20/2021 in [facility]  Total GAD-7 Score 3 0    PHQ2-9   Flowsheet Row Video Visit from 04/17/2021 in [facility] Video Visit from 02/20/2021 in [facility]  PHQ-2 Total Score 0 1    Flowsheet Row Video Visit from 04/17/2021 in [facility] ED from 03/06/2021 in [facility] Video Visit from 02/20/2021 in [facility]  C-SSRS RISK CATEGORY Low Risk No Risk Moderate Risk       Assessment and Plan:   Michele Mosey.  Baird is a 25 year old female with a past psychiatric history significant for major depressive disorder who presents to [facility] via virtual telephone visit for follow-up and medication management.  Patient denies any issues or concerns regarding her current medication regimen.  Patient states that she has become less reliant on her trazodone and will only be requiring refills on her Effexor.  Patient's medications will be e-prescribed to pharmacy of choice.  1. Mild episode of recurrent major depressive disorder (HCC)  - venlafaxine XR (EFFEXOR-XR) 150 MG 24 hr capsule; Take 1 capsule (150 mg total) by mouth daily.  Dispense: 30 capsule; Refill: 2  Patient to follow up in 53-months  Meta Hatchet, PA 04/17/2021, 1:25 PM

## 2021-07-17 ENCOUNTER — Telehealth (INDEPENDENT_AMBULATORY_CARE_PROVIDER_SITE_OTHER): Payer: No Payment, Other | Admitting: Physician Assistant

## 2021-07-17 ENCOUNTER — Other Ambulatory Visit: Payer: Self-pay

## 2021-07-17 ENCOUNTER — Encounter (HOSPITAL_COMMUNITY): Payer: Self-pay | Admitting: Physician Assistant

## 2021-07-17 DIAGNOSIS — F330 Major depressive disorder, recurrent, mild: Secondary | ICD-10-CM

## 2021-07-17 MED ORDER — VENLAFAXINE HCL ER 150 MG PO CP24: 150.0000 mg | ORAL_CAPSULE | Freq: Every day | ORAL | 2 refills | Status: DC

## 2021-07-17 NOTE — Progress Notes (Signed)
BH MD/PA/NP OP Progress Note  Virtual Visit via Telephone Note  I connected with [name] on 07/17/21 at  1:00 PM EDT by telephone and verified that I am speaking with the correct person using two identifiers.  Location: Patient: Home Provider:Clinic   I discussed the limitations, risks, security and privacy concerns of performing an evaluation and management service by telephone and the availability of in person appointments. I also discussed with the patient that there may be a patient responsible charge related to this service. The patient expressed understanding and agreed to proceed.  Follow Up Instructions:  I discussed the assessment and treatment plan with the patient. The patient was provided an opportunity to ask questions and all were answered. The patient agreed with the plan and demonstrated an understanding of the instructions.   The patient was advised to call back or seek an in-person evaluation if the symptoms worsen or if the condition fails to improve as anticipated.  I provided 18 minutes of non-face-to-face time during this encounter.  Meta Hatchet, PA    07/17/2021 2:00 PM [name]  MRN:  [id number]  Chief Complaint: Follow up and medication management  HPI:   [name] is a 25 year old female with a past psychiatric history significant for major depressive disorder who presents to [facility] via virtual telephone visit for follow-up and medication management.  Patient is currently being managed on the following medication: Venlafaxine XR 150 mg daily.  Patient reports no issues or concerns regarding her medication.  She does report feeling a little low, however, she expresses that it is nothing she can't manage.  She endorses the following depressive symptoms: fatigue, lack of motivation, changes in appetite, and feelings of worthlessness/guilt.  She denies irritability and sleep  disturbances.  Patient reports that her anxiety has been okay and states she has no problems related to her anxiety.  She rates her anxiety a 2-3 out of 10.  Patient denies any new stressors or major life changing events at this time.  A PHQ-9 screen was performed with the patient scoring a 7.  A GAD-7 screen was also performed with the patient scoring a 3.  A [place] Suicide Severity Rating Scale was performed with the patient being considered moderate risk.  Patient denies being a danger to self and is able to contract for safety following the conclusion of the encounter.  Patient is alert and oriented x4, calm, cooperative, and fully engaged in conversation during the encounter.  Patient reports that her mood is a little low.  Patient denies suicidal or homicidal ideations.  She further denies auditory or visual hallucinations and does not appear to be responding to internal/external stimuli.  Patient endorses good sleep and receives on average 7 to 8 hours of sleep each night.  Patient endorses fluctuating appetite stating that some days she eats all day while on others, she does not feel like eating anything.  Patient endorses eating on average 1 meal per day.  Patient endorses moderate alcohol consumption and states that she goes out for drinks 1-2 nights per week.  Patient denies tobacco use and illicit drug use.  Visit Diagnosis:    ICD-10-CM   1. Mild episode of recurrent major depressive disorder (HCC)  F33.0 venlafaxine XR (EFFEXOR-XR) 150 MG 24 hr capsule      Past Psychiatric History:  Major depressive disorder  Past Medical History:  Past Medical History:  Diagnosis Date   Anxiety  Depression    Medical history non-contributory     Past Surgical History:  Procedure Laterality Date   TYMPANOSTOMY TUBE PLACEMENT      Family Psychiatric History:  None  Family History: No family history on file.  Social History:  Social History   Socioeconomic History   Marital  status: Single    Spouse name: Not on file   Number of children: Not on file   Years of education: Not on file   Highest education level: Not on file  Occupational History   Not on file  Tobacco Use   Smoking status: Never   Smokeless tobacco: Never  Vaping Use   Vaping Use: Never used  Substance and Sexual Activity   Alcohol use: No   Drug use: No   Sexual activity: Never  Other Topics Concern   Not on file  Social History Narrative   Not on file   Social Determinants of Health   Financial Resource Strain: Not on file  Food Insecurity: Not on file  Transportation Needs: Not on file  Physical Activity: Not on file  Stress: Not on file  Social Connections: Not on file    Allergies: No Known Allergies  Metabolic Disorder Labs: Lab Results  Component Value Date   HGBA1C 4.7 (L) 03/06/2021   MPG 88.19 03/06/2021   MPG 105 03/25/2015   No results found for: PROLACTIN No results found for: CHOL, TRIG, HDL, CHOLHDL, VLDL, LDLCALC Lab Results  Component Value Date   TSH 1.864 04/08/2020   TSH 4.100 03/25/2015    Therapeutic Level Labs: No results found for: LITHIUM No results found for: VALPROATE No components found for:  CBMZ  Current Medications: Current Outpatient Medications  Medication Sig Dispense Refill   ondansetron (ZOFRAN ODT) 4 MG disintegrating tablet Take 1 tablet (4 mg total) by mouth every 8 (eight) hours as needed for up to 10 doses for nausea or vomiting. 10 tablet 0   traZODone (DESYREL) 50 MG tablet Take 25-50 mg by mouth at bedtime as needed for sleep.     venlafaxine XR (EFFEXOR-XR) 150 MG 24 hr capsule Take 1 capsule (150 mg total) by mouth daily. 30 capsule 2   No current facility-administered medications for this visit.     Musculoskeletal: Strength & Muscle Tone: Unable to assess due to telemedicine visit Gait & Station: Unable to assess due to telemedicine visit Patient leans: Unable to assess due to telemedicine  visit  Psychiatric Specialty Exam: Review of Systems  Psychiatric/Behavioral:  Negative for decreased concentration, dysphoric mood, hallucinations, self-injury, sleep disturbance and suicidal ideas. The patient is not nervous/anxious and is not hyperactive.    There were no vitals taken for this visit.There is no height or weight on file to calculate BMI.  General Appearance: Unable to assess due to telemedicine visit  Eye Contact:  Unable to assess due to telemedicine visit  Speech:  Clear and Coherent and Normal Rate  Volume:  Normal  Mood:  Depressed  Affect:  Congruent and Depressed  Thought Process:  Coherent and Descriptions of Associations: Intact  Orientation:  Full (Time, Place, and Person)  Thought Content: WDL   Suicidal Thoughts:  No  Homicidal Thoughts:  No  Memory:  Immediate;   Good Recent;   Good Remote;   Good  Judgement:  Good  Insight:  Good  Psychomotor Activity:  Normal  Concentration:  Concentration: Good and Attention Span: Good  Recall:  Good  Fund of Knowledge: Good  Language: Good  Akathisia:  NA  Handed:  Right  AIMS (if indicated): not done  Assets:  Communication Skills Desire for Improvement Housing Social Support Vocational/Educational  ADL's:  Intact  Cognition: WNL  Sleep:  Good   Screenings: AIMS    Flowsheet [facility]) [facility]  AIMS Total Score 0 0      AUDIT    Flowsheet Row [facility]) [facility]  Alcohol Use Disorder Identification Test Final Score (AUDIT) 0 0      GAD-7    Flowsheet Row Video Visit from 07/17/2021 in [facility] Video Visit from 04/17/2021 in [facility] Video Visit from 02/20/2021 in [facility]  Total GAD-7 Score 3 3 0      PHQ2-9    Flowsheet Row Video Visit from 07/17/2021 in [facility] Video Visit from 04/17/2021 in [facility] Video Visit from 02/20/2021 in [facility]  PHQ-2 Total Score 2 0 1  PHQ-9 Total Score 7 -- --      Flowsheet Row Video Visit from 07/17/2021 in [facility] Video Visit from 04/17/2021 in [facility] ED from 03/06/2021 in [facility]  C-SSRS RISK CATEGORY Moderate Risk Low Risk No Risk        Assessment and Plan:   [name] is a 25 year old female with a past psychiatric history significant for major depressive disorder who presents to [facility] via virtual telephone visit for follow-up and medication management.  She endorses mild depressive symptoms but states that her episodes have been tolerable.  Patient is denying the need for dosage adjustments at this time.  Patient is requesting refills on her medication.  Patient's medication to be e-prescribed to pharmacy of choice.  1. Mild episode of recurrent major depressive disorder (HCC)  - venlafaxine XR (EFFEXOR-XR) 150 MG 24 hr capsule; Take 1 capsule (150 mg total) by mouth daily.  Dispense: 30 capsule; Refill: 2  Patient to follow up in 2 months A total of 18 minutes was spent with the patient/reviewing patient's chart  Meta Hatchet, PA 07/17/2021, 2:00 PM

## 2021-09-18 ENCOUNTER — Telehealth (HOSPITAL_COMMUNITY): Payer: No Payment, Other | Admitting: Physician Assistant

## 2021-09-23 ENCOUNTER — Telehealth (HOSPITAL_COMMUNITY): Payer: Self-pay | Admitting: Physician Assistant

## 2021-09-23 NOTE — Telephone Encounter (Signed)
Patient called to reschedule missed appt on 9/22. Patient has been rescheduled to 11/9 at 5pm. Patient is needing meds bridged.

## 2021-11-05 ENCOUNTER — Telehealth (INDEPENDENT_AMBULATORY_CARE_PROVIDER_SITE_OTHER): Payer: No Payment, Other | Admitting: Physician Assistant

## 2021-11-05 DIAGNOSIS — F330 Major depressive disorder, recurrent, mild: Secondary | ICD-10-CM

## 2021-11-05 MED ORDER — VENLAFAXINE HCL ER 150 MG PO CP24: 150.0000 mg | ORAL_CAPSULE | Freq: Every day | ORAL | 0 refills | Status: DC

## 2021-11-05 NOTE — Progress Notes (Addendum)
BH MD/PA/NP OP Progress Note  Virtual Visit via Telephone Note  I connected with [name] on 11/05/21 at  5:00 PM EST by telephone and verified that I am speaking with the correct person using two identifiers.  Location: Patient: Home Provider: Clinic   I discussed the limitations, risks, security and privacy concerns of performing an evaluation and management service by telephone and the availability of in person appointments. I also discussed with the patient that there may be a patient responsible charge related to this service. The patient expressed understanding and agreed to proceed.  Follow Up Instructions:   I discussed the assessment and treatment plan with the patient. The patient was provided an opportunity to ask questions and all were answered. The patient agreed with the plan and demonstrated an understanding of the instructions.   The patient was advised to call back or seek an in-person evaluation if the symptoms worsen or if the condition fails to improve as anticipated.  I provided 14 minutes of non-face-to-face time during this encounter.  Meta Hatchet, PA    11/05/2021 10:52 PM DOMENIQUE QUEST  MRN:  [id number]  Chief Complaint: Follow up and medication management  HPI:   [name]. [name] is a 25 year old female with a past psychiatric history significant for major depressive disorder who presents to [facility] via virtual telephone visit for follow-up and medication management.  Patient is currently being managed on the following medication: Venlafaxine XR 150 mg 24-hour capsule daily.  Patient reports no issues or concerns regarding her current medication regimen.  She endorses mild depression but states that her symptoms are mostly manageable.  Patient endorses the following depressive symptoms: loneliness, lack of motivation, decreased energy, irritability, and worthlessness.  She denies feelings of guilt and  hopelessness.  She further denies anxiety.  Patient denies any new stressors at this time and states that she is currently on vacation and doing [place].  Patient is alert and oriented x4, calm, cooperative, and fully engaged in conversation during the encounter.  Patient endorses good mood.  Patient denies suicidal or homicidal ideations.  She further denies auditory or visual hallucinations and does not appear to be responding to internal/external stimuli.  Patient endorses good sleep and receives on average 7 to 8 hours of sleep each night.  Patient endorses good appetite and eats on average 2-3 meals per day.  Patient denies alcohol consumption, tobacco use, and illicit drug use.  Visit Diagnosis:    ICD-10-CM   1. Mild episode of recurrent major depressive disorder (HCC)  F33.0 venlafaxine XR (EFFEXOR-XR) 150 MG 24 hr capsule      Past Psychiatric History:  Major depressive disorder  Past Medical History:  Past Medical History:  Diagnosis Date   Anxiety    Depression    Medical history non-contributory     Past Surgical History:  Procedure Laterality Date   TYMPANOSTOMY TUBE PLACEMENT      Family Psychiatric History:  None  Family History: No family history on file.  Social History:  Social History   Socioeconomic History   Marital status: Single    Spouse name: Not on file   Number of children: Not on file   Years of education: Not on file   Highest education level: Not on file  Occupational History   Not on file  Tobacco Use   Smoking status: Never   Smokeless tobacco: Never  Vaping Use   Vaping Use: Never used  Substance and Sexual Activity   Alcohol use: No   Drug use: No   Sexual activity: Never  Other Topics Concern   Not on file  Social History Narrative   Not on file   Social Determinants of Health   Financial Resource Strain: Not on file  Food Insecurity: Not on file  Transportation Needs: Not on file  Physical Activity: Not on file   Stress: Not on file  Social Connections: Not on file    Allergies: No Known Allergies  Metabolic Disorder Labs: Lab Results  Component Value Date   HGBA1C 4.7 (L) 03/06/2021   MPG 88.19 03/06/2021   MPG 105 03/25/2015   No results found for: PROLACTIN No results found for: CHOL, TRIG, HDL, CHOLHDL, VLDL, LDLCALC Lab Results  Component Value Date   TSH 1.864 04/08/2020   TSH 4.100 03/25/2015    Therapeutic Level Labs: No results found for: LITHIUM No results found for: VALPROATE No components found for:  CBMZ  Current Medications: Current Outpatient Medications  Medication Sig Dispense Refill   ondansetron (ZOFRAN ODT) 4 MG disintegrating tablet Take 1 tablet (4 mg total) by mouth every 8 (eight) hours as needed for up to 10 doses for nausea or vomiting. 10 tablet 0   traZODone (DESYREL) 50 MG tablet Take 25-50 mg by mouth at bedtime as needed for sleep.     venlafaxine XR (EFFEXOR-XR) 150 MG 24 hr capsule Take 1 capsule (150 mg total) by mouth daily. 30 capsule 0   No current facility-administered medications for this visit.     Musculoskeletal: Strength & Muscle Tone: Unable to assess due to telemedicine visit Gait & Station: Unable to assess due to telemedicine visit Patient leans: Unable to assess due to telemedicine visit  Psychiatric Specialty Exam: Review of Systems  Psychiatric/Behavioral:  Negative for decreased concentration, dysphoric mood, hallucinations, self-injury, sleep disturbance and suicidal ideas. The patient is not nervous/anxious and is not hyperactive.    There were no vitals taken for this visit.There is no height or weight on file to calculate BMI.  General Appearance: Unable to assess due to telemedicine visit  Eye Contact:  Unable to assess due to telemedicine visit  Speech:  Clear and Coherent and Normal Rate  Volume:  Normal  Mood:  Anxious and Euthymic  Affect:  Congruent  Thought Process:  Coherent, Goal Directed, and Descriptions of  Associations: Intact  Orientation:  Full (Time, Place, and Person)  Thought Content: WDL and Logical   Suicidal Thoughts:  No  Homicidal Thoughts:  No  Memory:  Immediate;   Good Recent;   Good Remote;   Good  Judgement:  Good  Insight:  Good  Psychomotor Activity:  Normal  Concentration:  Concentration: Good and Attention Span: Good  Recall:  Good  Fund of Knowledge: Good  Language: Good  Akathisia:  NA  Handed:  Right  AIMS (if indicated): not done  Assets:  Communication Skills Desire for Improvement Housing Social Support Vocational/Educational  ADL's:  Intact  Cognition: WNL  Sleep:  Good   Screenings: AIMS    Flowsheet Row [facility]) [facility]  AIMS Total Score 0 0      AUDIT    Flowsheet Row [facility]) [facility] Ad[facility] ([facility]  Alcohol Use Disorder Identification Test Final Score (  AUDIT) 0 0      GAD-7    Flowsheet Row Video Visit from 11/05/2021 in [facility] Video Visit from 07/17/2021 in [facility] Video Visit from 04/17/2021 in [facility] Video Visit from 02/20/2021 in [facility]  Total GAD-7 Score 1 3 3  0      PHQ2-9    Flowsheet Row Video Visit from 11/05/2021 in [facility] Video Visit from 07/17/2021 in [facility] Video Visit from 04/17/2021 in [facility] Video Visit from 02/20/2021 in [facility]  PHQ-2 Total Score 1 2 0 1  PHQ-9 Total Score -- 7 -- --      Flowsheet Row Video Visit from 11/05/2021 in [facility] Video Visit from 07/17/2021 in [facility] Video Visit from 04/17/2021 in [facility]  C-SSRS RISK CATEGORY Low Risk Moderate Risk Low Risk        Assessment and Plan:   [name]. Hullum is a 25 year old female with a past psychiatric history significant for major depressive disorder who presents to [facility] via virtual telephone visit for follow-up and medication management.  Patient endorses mild depressive symptoms but states that they have been manageable.  Patient would like to continue taking her medication as prescribed.  Patient's medication to be e-prescribed to preferred pharmacy.  1. Mild episode of recurrent major depressive disorder (HCC)  - venlafaxine XR (EFFEXOR-XR) 150 MG 24 hr capsule; Take 1 capsule (150 mg total) by mouth daily.  Dispense: 30 capsule; Refill: 0  Patient to follow up in 2 months Provider spent a total of 17 minutes with the patient/reviewing the patient's chart  [facility], PA 11/05/2021, 10:52 PM

## 2021-12-11 ENCOUNTER — Other Ambulatory Visit (HOSPITAL_COMMUNITY): Payer: Self-pay | Admitting: Physician Assistant

## 2021-12-11 DIAGNOSIS — F330 Major depressive disorder, recurrent, mild: Secondary | ICD-10-CM

## 2022-01-08 ENCOUNTER — Telehealth (INDEPENDENT_AMBULATORY_CARE_PROVIDER_SITE_OTHER): Payer: No Payment, Other | Admitting: Physician Assistant

## 2022-01-08 DIAGNOSIS — F330 Major depressive disorder, recurrent, mild: Secondary | ICD-10-CM | POA: Diagnosis not present

## 2022-01-08 MED ORDER — VENLAFAXINE HCL ER 150 MG PO CP24: 150.0000 mg | ORAL_CAPSULE | Freq: Every day | ORAL | 2 refills | Status: DC

## 2022-01-08 NOTE — Progress Notes (Signed)
BH MD/PA/NP OP Progress Note  Virtual Visit via Telephone Note  I connected with [name] on 01/08/22 at  3:30 PM EST by telephone and verified that I am speaking with the correct person using two identifiers.  Location: Patient: Home Provider: Clinic   I discussed the limitations, risks, security and privacy concerns of performing an evaluation and management service by telephone and the availability of in person appointments. I also discussed with the patient that there may be a patient responsible charge related to this service. The patient expressed understanding and agreed to proceed.  Follow Up Instructions:  I discussed the assessment and treatment plan with the patient. The patient was provided an opportunity to ask questions and all were answered. The patient agreed with the plan and demonstrated an understanding of the instructions.   The patient was advised to call back or seek an in-person evaluation if the symptoms worsen or if the condition fails to improve as anticipated.  I provided 10 minutes of non-face-to-face time during this encounter.  Meta HatchetUchenna E Arella Blinder, PA    01/08/2022 4:57 PM Michele MachoEmily B Baird  MRN:  [id number]  Chief Complaint: Follow up and medication management  HPI:   [name] is a 26 year old female with a past psychiatric history significant for major depressive disorder [facility] virtual telephone visit for follow-up and medication management.  Patient is currently being managed on the following medication: Effexor XR 150 mg 24-hour capsule daily.    Patient reports no issues or concerns regarding her current medication regimen and states that Effexor is still helpful in managing her symptoms.  Patient is requesting refills on her medications following the conclusion of the encounter.  Patient denies depressive episodes nor does she experience anxiety.  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  Patient is alert and oriented x4, calm, cooperative, and fully engaged in conversation during the encounter.  Patient endorses good mood.  Patient denies suicidal or homicidal ideations.  Patient further denies auditory or visual hallucinations and does not appear to be responding to internal/external stimuli.  Patient denies paranoia.  Patient endorses good sleep and receives on average 8 to 9 hours of sleep each night.  Patient endorses good appetite and eats on average 2-3 meals per day.  Patient denies alcohol consumption, tobacco use, and illicit drug use.  Visit Diagnosis:    ICD-10-CM   1. Mild episode of recurrent major depressive disorder (HCC)  F33.0 venlafaxine XR (EFFEXOR-XR) 150 MG 24 hr capsule      Past Psychiatric History:  Major depressive disorder  Past Medical History:  Past Medical History:  Diagnosis Date   Anxiety    Depression    Medical history non-contributory     Past Surgical History:  Procedure Laterality Date   TYMPANOSTOMY TUBE PLACEMENT      Family Psychiatric History:  None  Family History: No family history on file.  Social History:  Social History   Socioeconomic History   Marital status: Single    Spouse name: Not on file   Number of children: Not on file   Years of education: Not on file   Highest education level: Not on file  Occupational History   Not on file  Tobacco Use   Smoking status: Never   Smokeless tobacco: Never  Vaping Use   Vaping Use: Never used  Substance and Sexual Activity   Alcohol use:  No   Drug use: No   Sexual activity: Never  Other Topics Concern   Not on file  Social History Narrative   Not on file   Social Determinants of Health   Financial Resource Strain: Not on file  Food Insecurity: Not on file  Transportation Needs: Not on file  Physical Activity: Not on file  Stress: Not on file  Social Connections: Not on file     Allergies: No Known Allergies  Metabolic Disorder Labs: Lab Results  Component Value Date   HGBA1C 4.7 (L) 03/06/2021   MPG 88.19 03/06/2021   MPG 105 03/25/2015   No results found for: PROLACTIN No results found for: CHOL, TRIG, HDL, CHOLHDL, VLDL, LDLCALC Lab Results  Component Value Date   TSH 1.864 04/08/2020   TSH 4.100 03/25/2015    Therapeutic Level Labs: No results found for: LITHIUM No results found for: VALPROATE No components found for:  CBMZ  Current Medications: Current Outpatient Medications  Medication Sig Dispense Refill   ondansetron (ZOFRAN ODT) 4 MG disintegrating tablet Take 1 tablet (4 mg total) by mouth every 8 (eight) hours as needed for up to 10 doses for nausea or vomiting. 10 tablet 0   traZODone (DESYREL) 50 MG tablet Take 25-50 mg by mouth at bedtime as needed for sleep.     venlafaxine XR (EFFEXOR-XR) 150 MG 24 hr capsule Take 1 capsule (150 mg total) by mouth daily. 30 capsule 2   No current facility-administered medications for this visit.     Musculoskeletal: Strength & Muscle Tone: Unable to assess due to telemedicine visit Gait & Station: Unable to assess due to telemedicine visit Patient leans: Unable to assess due to telemedicine visit  Psychiatric Specialty Exam: Review of Systems  Psychiatric/Behavioral:  Negative for decreased concentration, dysphoric mood, hallucinations, self-injury, sleep disturbance and suicidal ideas. The patient is not nervous/anxious and is not hyperactive.    There were no vitals taken for this visit.There is no height or weight on file to calculate BMI.  General Appearance: Unable to assess due to telemedicine visit  Eye Contact:  Unable to assess due to telemedicine visit  Speech:  Clear and Coherent and Normal Rate  Volume:  Normal  Mood:  Euthymic  Affect:  Appropriate  Thought Process:  Coherent and Descriptions of Associations: Intact  Orientation:  Full (Time, Place, and Person)  Thought  Content: WDL   Suicidal Thoughts:  No  Homicidal Thoughts:  No  Memory:  Immediate;   Good Recent;   Good Remote;   Good  Judgement:  Good  Insight:  Good  Psychomotor Activity:  Normal  Concentration:  Concentration: Good and Attention Span: Good  Recall:  Good  Fund of Knowledge: Good  Language: Good  Akathisia:  NA  Handed:  Right  AIMS (if indicated): not done  Assets:  Communication Skills Desire for Improvement Housing Social Support Vocational/Educational  ADL's:  Intact  Cognition: WNL  Sleep:  Good   Screenings: AIMS    Flowsheet Row [facility]) [facility]  AIMS Total Score 0 0      AUDIT    Flowsheet Row [facility]) [facility]  Alcohol Use Disorder Identification Test Final Score (AUDIT) 0 0      GAD-7    Flowsheet Row  Video Visit from 01/08/2022 in [facility] Video Visit from 11/05/2021 in [facility] Video Visit from 07/17/2021 in [facility] Video Visit from 04/17/2021 in [facility] Video Visit from 02/20/2021 in [facility]  Total GAD-7 Score 1 1 3 3  0      PHQ2-9    Flowsheet Row Video Visit from 01/08/2022 in [facility] Video Visit from 11/05/2021 in [facility] Video Visit from 07/17/2021 in [facility] Video Visit from 04/17/2021 in [facility] Video Visit from 02/20/2021 in [facility]  PHQ-2 Total Score 0 1 2 0 1  PHQ-9 Total Score -- -- 7 -- --      Flowsheet Row  Video Visit from 01/08/2022 in [facility] Video Visit from 11/05/2021 in [facility] Video Visit from 07/17/2021 in [facility]  C-SSRS RISK CATEGORY Low Risk Low Risk Moderate Risk        Assessment and Plan:   [name] is a 26 year old female with a past psychiatric history significant for major depressive disorder who presents to [facility] via virtual telephone visit for follow-up and medication management.  Patient reports no issues or concerns regarding her current medication regimen.  Patient denies the need for dosage adjustments at this time and is requesting refills on her medication following the conclusion of the encounter.  Patient's medications to be e-prescribed to pharmacy of choice.  1. Mild episode of recurrent major depressive disorder (HCC)  - venlafaxine XR (EFFEXOR-XR) 150 MG 24 hr capsule; Take 1 capsule (150 mg total) by mouth daily.  Dispense: 30 capsule; Refill: 2  Patient to follow up in 3 months Provider spent a total of 10 minutes with the patient/reviewing the patient's chart  [facility], PA 01/08/2022, 4:57 PM

## 2022-01-11 ENCOUNTER — Encounter (HOSPITAL_COMMUNITY): Payer: Self-pay | Admitting: Physician Assistant

## 2022-01-25 ENCOUNTER — Encounter (HOSPITAL_COMMUNITY): Payer: Self-pay | Admitting: Physician Assistant

## 2022-02-14 ENCOUNTER — Other Ambulatory Visit (HOSPITAL_COMMUNITY): Payer: Self-pay | Admitting: Physician Assistant

## 2022-02-14 DIAGNOSIS — F330 Major depressive disorder, recurrent, mild: Secondary | ICD-10-CM

## 2022-03-30 ENCOUNTER — Other Ambulatory Visit (HOSPITAL_COMMUNITY): Payer: Self-pay | Admitting: Physician Assistant

## 2022-03-30 DIAGNOSIS — F330 Major depressive disorder, recurrent, mild: Secondary | ICD-10-CM

## 2022-03-30 IMAGING — DX DG CHEST 1V
1 series · 1 of 1 positions shown · non-contrast
Comparison: No prior.

CLINICAL DATA: Shortness of breath.  Chest pain.

EXAM:
CHEST  1 VIEW

[chest ap]
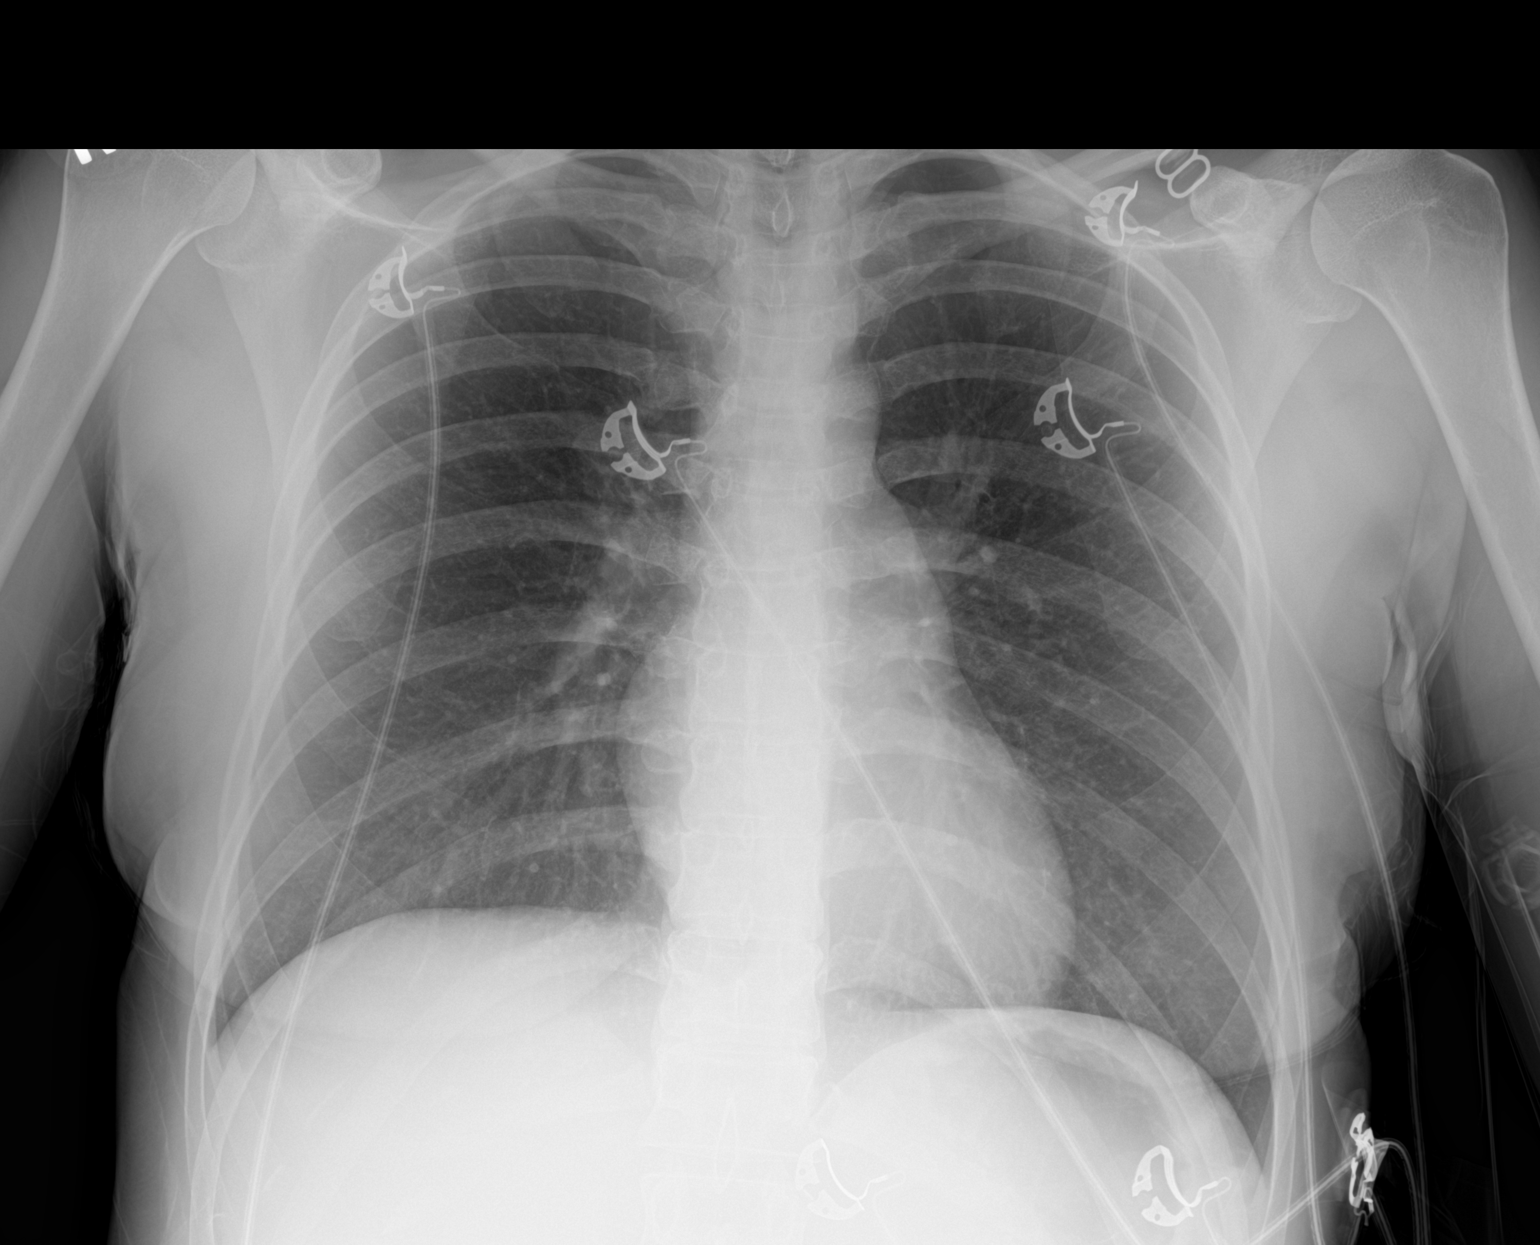

[1 of 1 positions shown; findings below may reference images not displayed]

FINDINGS: Mediastinum and hilar structures normal. Heart size normal. No focal
infiltrate. No pleural effusion or pneumothorax.
IMPRESSION: No acute cardiopulmonary disease.

## 2022-03-31 ENCOUNTER — Telehealth (HOSPITAL_COMMUNITY): Payer: Self-pay | Admitting: *Deleted

## 2022-03-31 NOTE — Telephone Encounter (Signed)
Provider was contacted by Orpah Clinton. Reola Calkins, RN regarding patient's medication refill request. Patient's medication was e-prescribed to pharmacy of choice.

## 2022-03-31 NOTE — Telephone Encounter (Signed)
Pharmacy request for pts venlafaxine ER she should be out as of late March and her next appt with her provider is on 04/09/22. Will forward the request to her provider which is [place]. ?

## 2022-04-09 ENCOUNTER — Encounter (HOSPITAL_COMMUNITY): Payer: Self-pay | Admitting: Physician Assistant

## 2022-04-09 ENCOUNTER — Telehealth (INDEPENDENT_AMBULATORY_CARE_PROVIDER_SITE_OTHER): Payer: No Payment, Other | Admitting: Physician Assistant

## 2022-04-09 DIAGNOSIS — F330 Major depressive disorder, recurrent, mild: Secondary | ICD-10-CM

## 2022-04-09 NOTE — Progress Notes (Addendum)
BH MD/PA/NP OP Progress Note ? ?Virtual Visit via Video Note ? ?I connected with [name] on 04/09/22 at 11:00 AM EDT by a video enabled telemedicine application and verified that I am speaking with the correct person using two identifiers. ? ?Location: ?Patient: Home ?Provider: Clinic ?  ?I discussed the limitations of evaluation and management by telemedicine and the availability of in person appointments. The patient expressed understanding and agreed to proceed. ? ?Follow Up Instructions: ? ?I discussed the assessment and treatment plan with the patient. The patient was provided an opportunity to ask questions and all were answered. The patient agreed with the plan and demonstrated an understanding of the instructions. ?  ?The patient was advised to call back or seek an in-person evaluation if the symptoms worsen or if the condition fails to improve as anticipated. ? ?I provided 13 minutes of non-face-to-face time during this encounter. ? ?Meta Hatchet, PA  ? ? ?04/09/2022 8:01 PM ?[name]  ?MRN:  [id number] ? ?Chief Complaint:  ?Chief Complaint  ?Patient presents with  ? Follow-up  ? Medication Refill  ? ?HPI:  ? ?[name] is a 26 year old female with a past psychiatric history significant for major depressive disorder who presents to [facility] via virtual video visit for follow-up and medication management.  Patient is currently being managed on the following medication: Venlafaxine XR (Effexor XR) 150 mg 24-hour capsule daily. ? ?Patient reports that she has been having issues with sleep and fatigue.  Patient reports that she receives on average 9 to 10 hours of sleep each night but still feels tired during the day.  Patient believes that the cause of her fatigue may be due to receiving too much sleep.  Patient endorses feeling a little low a week ago but was able to bounce back.  Patient denies any major events in her life.  She reports that  she recently obtained a new job that aligned with her career aspirations.  Patient denies anxiety.  A PHQ-9 screen was performed with the patient scoring a 9.  A GAD-7 screen was also performed with the patient scoring a 3. ? ?Patient is alert and oriented x4, calm, cooperative, and fully engaged in conversation during the encounter.  Patient reports that she feels good today.  Patient denies suicidal or homicidal ideations.  She further denies auditory or visual hallucinations and does not appear to be responding to internal/external stimuli.  Patient endorses fair sleep and receives on average 9 to 10 hours of sleep each night.  Patient endorses increased appetite and eats on average 3 meals per day.  Patient denies alcohol consumption, tobacco use, and illicit drug use. ? ?Visit Diagnosis:  ?  ICD-10-CM   ?1. Mild episode of recurrent major depressive disorder (HCC)  F33.0   ?  ? ? ?Past Psychiatric History:  ?Major depressive disorder ? ?Past Medical History:  ?Past Medical History:  ?Diagnosis Date  ? Anxiety   ? Depression   ? Medical history non-contributory   ?  ?Past Surgical History:  ?Procedure Laterality Date  ? TYMPANOSTOMY TUBE PLACEMENT    ? ? ?Family Psychiatric History:  ?None ? ?Family History: History reviewed. No pertinent family history. ? ?Social History:  ?Social History  ? ?Socioeconomic History  ? Marital status: Single  ?  Spouse name: Not on file  ? Number of children: Not on file  ? Years of education: Not on file  ? Highest education level:  Not on file  ?Occupational History  ? Not on file  ?Tobacco Use  ? Smoking status: Never  ? Smokeless tobacco: Never  ?Vaping Use  ? Vaping Use: Never used  ?Substance and Sexual Activity  ? Alcohol use: No  ? Drug use: No  ? Sexual activity: Never  ?Other Topics Concern  ? Not on file  ?Social History Narrative  ? Not on file  ? ?Social Determinants of Health  ? ?Financial Resource Strain: Not on file  ?Food Insecurity: Not on file  ?Transportation  Needs: Not on file  ?Physical Activity: Not on file  ?Stress: Not on file  ?Social Connections: Not on file  ? ? ?Allergies: No Known Allergies ? ?Metabolic Disorder Labs: ?Lab Results  ?Component Value Date  ? HGBA1C 4.7 (L) 03/06/2021  ? MPG 88.19 03/06/2021  ? MPG 105 03/25/2015  ? ?No results found for: PROLACTIN ?No results found for: CHOL, TRIG, HDL, CHOLHDL, VLDL, LDLCALC ?Lab Results  ?Component Value Date  ? TSH 1.864 04/08/2020  ? TSH 4.100 03/25/2015  ? ? ?Therapeutic Level Labs: ?No results found for: LITHIUM ?No results found for: VALPROATE ?No components found for:  CBMZ ? ?Current Medications: ?Current Outpatient Medications  ?Medication Sig Dispense Refill  ? ondansetron (ZOFRAN ODT) 4 MG disintegrating tablet Take 1 tablet (4 mg total) by mouth every 8 (eight) hours as needed for up to 10 doses for nausea or vomiting. 10 tablet 0  ? traZODone (DESYREL) 50 MG tablet Take 25-50 mg by mouth at bedtime as needed for sleep.    ? venlafaxine XR (EFFEXOR-XR) 150 MG 24 hr capsule Take 1 capsule (150 mg total) by mouth daily. 30 capsule 2  ? ?No current facility-administered medications for this visit.  ? ? ? ?Musculoskeletal: ?Strength & Muscle Tone: within normal limits ?Gait & Station: normal ?Patient leans: N/A ? ?Psychiatric Specialty Exam: ?Review of Systems  ?Psychiatric/Behavioral:  Positive for sleep disturbance. Negative for decreased concentration, dysphoric mood, hallucinations, self-injury and suicidal ideas. The patient is not nervous/anxious and is not hyperactive.    ?There were no vitals taken for this visit.There is no height or weight on file to calculate BMI.  ?General Appearance: Casual  ?Eye Contact:  Good  ?Speech:  Clear and Coherent and Normal Rate  ?Volume:  Normal  ?Mood:  Depressed  ?Affect:  Congruent  ?Thought Process:  Coherent and Descriptions of Associations: Intact  ?Orientation:  Full (Time, Place, and Person)  ?Thought Content: WDL   ?Suicidal Thoughts:  No  ?Homicidal  Thoughts:  No  ?Memory:  Immediate;   Good ?Recent;   Good ?Remote;   Good  ?Judgement:  Good  ?Insight:  Good  ?Psychomotor Activity:  Normal  ?Concentration:  Concentration: Good and Attention Span: Good  ?Recall:  Good  ?Fund of Knowledge: Good  ?Language: Good  ?Akathisia:  No  ?Handed:  Right  ?AIMS (if indicated): not done  ?Assets:  Communication Skills ?Desire for Improvement ?Housing ?Social Support ?Vocational/Educational  ?ADL's:  Intact  ?Cognition: WNL  ?Sleep:  Fair  ? ?Screenings: ?AIMS   ? ?Flowsheet Row [facility] Ad[facility]  ?AIMS Total Score 0 0  ? ?  ? ?AUDIT   ? ?Flowsheet Row [facility] Ad[facility]  ?Alcohol Use Disorder Identification Test  Final Score (AUDIT) 0 0  ? ?  ? ?GAD-7   ? ?Flowsheet Row [facility]  ?Total GAD-7 Score 3 1 1 3 3   ? ?  ? ?PHQ2-9   ? ?Flowsheet Row [facility]  ?PHQ-2 Total Score 2 0 1 2 0  ?PHQ-9 Total Score 9 -- -- 7 --  ? ?  ? ?Flowsheet Row [facility]  ?C-SSRS RISK CATEGORY Low Risk Low Risk Low Ris

## 2022-04-26 ENCOUNTER — Other Ambulatory Visit (HOSPITAL_COMMUNITY): Payer: Self-pay | Admitting: Physician Assistant

## 2022-04-26 DIAGNOSIS — F330 Major depressive disorder, recurrent, mild: Secondary | ICD-10-CM

## 2022-07-02 ENCOUNTER — Encounter: Payer: Self-pay | Admitting: Radiology

## 2022-07-06 ENCOUNTER — Encounter (HOSPITAL_COMMUNITY): Payer: Self-pay

## 2022-07-07 ENCOUNTER — Telehealth (HOSPITAL_COMMUNITY): Payer: No Payment, Other | Admitting: Physician Assistant

## 2022-08-16 ENCOUNTER — Other Ambulatory Visit (HOSPITAL_COMMUNITY): Payer: Self-pay | Admitting: Physician Assistant

## 2022-08-16 DIAGNOSIS — F330 Major depressive disorder, recurrent, mild: Secondary | ICD-10-CM

## 2022-08-18 NOTE — Telephone Encounter (Signed)
Patient needs a follow up appointment.

## 2022-08-25 ENCOUNTER — Telehealth (HOSPITAL_COMMUNITY): Payer: Self-pay | Admitting: *Deleted

## 2022-08-25 ENCOUNTER — Other Ambulatory Visit (HOSPITAL_COMMUNITY): Payer: Self-pay | Admitting: Physician Assistant

## 2022-08-25 DIAGNOSIS — F330 Major depressive disorder, recurrent, mild: Secondary | ICD-10-CM

## 2022-08-25 MED ORDER — VENLAFAXINE HCL ER 150 MG PO CP24: 150.0000 mg | ORAL_CAPSULE | Freq: Every day | ORAL | 0 refills | Status: DC

## 2022-08-25 NOTE — Telephone Encounter (Signed)
Provider to e-prescribe a 30-day supply of venlafaxine for the patient and reschedule a follow up appointment for this patient.

## 2022-08-25 NOTE — Progress Notes (Signed)
Provider to e-prescribe a 30-day supply of venlafaxine for the patient and reschedule a follow up appointment for this patient.

## 2022-08-25 NOTE — Telephone Encounter (Signed)
Rx REFILL REQUEST:: venlafaxine XR (EFFEXOR-XR) 150 MG 24 hr capsule

## 2022-09-08 ENCOUNTER — Telehealth (INDEPENDENT_AMBULATORY_CARE_PROVIDER_SITE_OTHER): Payer: No Payment, Other | Admitting: Physician Assistant

## 2022-09-08 DIAGNOSIS — F330 Major depressive disorder, recurrent, mild: Secondary | ICD-10-CM

## 2022-09-08 NOTE — Progress Notes (Signed)
BH MD/PA/NP OP Progress Note  Virtual Visit via Video Note  I connected with [name] on 09/08/22 at 11:30 AM EDT by a video enabled telemedicine application and verified that I am speaking with the correct person using two identifiers.  Location: Patient: Home Provider: Clinic   I discussed the limitations of evaluation and management by telemedicine and the availability of in person appointments. The patient expressed understanding and agreed to proceed.  Follow Up Instructions:  I discussed the assessment and treatment plan with the patient. The patient was provided an opportunity to ask questions and all were answered. The patient agreed with the plan and demonstrated an understanding of the instructions.   The patient was advised to call back or seek an in-person evaluation if the symptoms worsen or if the condition fails to improve as anticipated.  I provided 13 minutes of non-face-to-face time during this encounter.  Meta Hatchet, PA    09/08/2022 12:05 PM AUNNA SNOOKS  MRN:  [id number]  Chief Complaint:  No chief complaint on file.  HPI:   [name]. Mcquary is a 26 year old female with a past psychiatric history significant for major depressive disorder who presents to [facility] via virtual video visit for follow-up and medication management.  Patient is currently being managed on the following medication: Venlafaxine XR (Effexor XR) 150 mg 24-hour capsule daily.  Patient reports that she has been having issues with sleep and fatigue.  Patient reports that she receives on average 9 to 10 hours of sleep each night but still feels tired during the day.  Patient believes that the cause of her fatigue may be due to receiving too much sleep.  Patient endorses feeling a little low a week ago but was able to bounce back.  Patient denies any major events in her life.  She reports that she recently obtained a new job that aligned  with her career aspirations.  Patient denies anxiety.  A PHQ-9 screen was performed with the patient scoring a 9.  A GAD-7 screen was also performed with the patient scoring a 3.  Patient is alert and oriented x4, calm, cooperative, and fully engaged in conversation during the encounter.  Patient reports that she feels good today.  Patient denies suicidal or homicidal ideations.  She further denies auditory or visual hallucinations and does not appear to be responding to internal/external stimuli.  Patient endorses fair sleep and receives on average 9 to 10 hours of sleep each night.  Patient endorses increased appetite and eats on average 3 meals per day.  Patient denies alcohol consumption, tobacco use, and illicit drug use.  Visit Diagnosis:  No diagnosis found.   Past Psychiatric History:  Major depressive disorder  Past Medical History:  Past Medical History:  Diagnosis Date  . Anxiety   . Depression   . Medical history non-contributory     Past Surgical History:  Procedure Laterality Date  . TYMPANOSTOMY TUBE PLACEMENT      Family Psychiatric History:  None  Family History: No family history on file.  Social History:  Social History   Socioeconomic History  . Marital status: Significant Other    Spouse name: Not on file  . Number of children: Not on file  . Years of education: Not on file  . Highest education level: Not on file  Occupational History  . Not on file  Tobacco Use  . Smoking status: Never  . Smokeless tobacco: Never  Vaping  Use  . Vaping Use: Never used  Substance and Sexual Activity  . Alcohol use: No  . Drug use: No  . Sexual activity: Never  Other Topics Concern  . Not on file  Social History Narrative  . Not on file   Social Determinants of Health   Financial Resource Strain: Not on file  Food Insecurity: Not on file  Transportation Needs: Not on file  Physical Activity: Not on file  Stress: Not on file  Social Connections: Not on  file    Allergies: No Known Allergies  Metabolic Disorder Labs: Lab Results  Component Value Date   HGBA1C 4.7 (L) 03/06/2021   MPG 88.19 03/06/2021   MPG 105 03/25/2015   No results found for: "PROLACTIN" No results found for: "CHOL", "TRIG", "HDL", "CHOLHDL", "VLDL", "LDLCALC" Lab Results  Component Value Date   TSH 1.864 04/08/2020   TSH 4.100 03/25/2015    Therapeutic Level Labs: No results found for: "LITHIUM" No results found for: "VALPROATE" No results found for: "CBMZ"  Current Medications: Current Outpatient Medications  Medication Sig Dispense Refill  . ondansetron (ZOFRAN ODT) 4 MG disintegrating tablet Take 1 tablet (4 mg total) by mouth every 8 (eight) hours as needed for up to 10 doses for nausea or vomiting. 10 tablet 0  . traZODone (DESYREL) 50 MG tablet Take 25-50 mg by mouth at bedtime as needed for sleep.    Marland Kitchen venlafaxine XR (EFFEXOR-XR) 150 MG 24 hr capsule Take 1 capsule (150 mg total) by mouth daily. 30 capsule 0   No current facility-administered medications for this visit.     Musculoskeletal: Strength & Muscle Tone: within normal limits Gait & Station: normal Patient leans: N/A  Psychiatric Specialty Exam: Review of Systems  Psychiatric/Behavioral:  Positive for sleep disturbance. Negative for decreased concentration, dysphoric mood, hallucinations, self-injury and suicidal ideas. The patient is not nervous/anxious and is not hyperactive.     There were no vitals taken for this visit.There is no height or weight on file to calculate BMI.  General Appearance: Casual  Eye Contact:  Good  Speech:  Clear and Coherent and Normal Rate  Volume:  Normal  Mood:  Depressed  Affect:  Congruent  Thought Process:  Coherent and Descriptions of Associations: Intact  Orientation:  Full (Time, Place, and Person)  Thought Content: WDL   Suicidal Thoughts:  No  Homicidal Thoughts:  No  Memory:  Immediate;   Good Recent;   Good Remote;   Good   Judgement:  Good  Insight:  Good  Psychomotor Activity:  Normal  Concentration:  Concentration: Good and Attention Span: Good  Recall:  Good  Fund of Knowledge: Good  Language: Good  Akathisia:  No  Handed:  Right  AIMS (if indicated): not done  Assets:  Communication Skills Desire for Improvement Housing Social Support Vocational/Educational  ADL's:  Intact  Cognition: WNL  Sleep:  Fair   Screenings: AIMS    Flowsheet [facility]  AIMS Total Score 0 0      AUDIT    Flowsheet Row [facility]  Alcohol Use Disorder Identification Test Final Score (AUDIT) 0 0      GAD-7    Flowsheet Row Video Visit from 09/08/2022 in [facility]  Video Visit from 04/09/2022 in [facility] Video Visit from 01/08/2022 in [facility] Video Visit from 11/05/2021 in [facility] Video Visit from 07/17/2021 in [facility]  Total GAD-7 Score 10 3 1 1 3       PHQ2-9    Flowsheet Row Video Visit from 09/08/2022 in [facility] Video Visit from 04/09/2022 in [facility] Video Visit from 01/08/2022 in [facility] Video Visit from 11/05/2021 in [facility] Video Visit from 07/17/2021 in [facility]  PHQ-2 Total Score 1 2 0 1 2  PHQ-9 Total Score -- 9 -- -- 7      Flowsheet Row Video Visit from 09/08/2022 in [facility] Video Visit from 04/09/2022 in [facility] Video Visit from 01/08/2022 in [facility]  C-SSRS RISK CATEGORY Low Risk Low Risk Low Risk        Assessment and Plan:   Melissia Lahman. Canepa is a 26 year old female with a past psychiatric history significant for major depressive disorder who presents to [facility] via virtual video visit for follow-up and medication management.  Patient reports feeling well a week ago but denies any major depressive symptoms during this encounter.  Patient denies anxiety.  Patient reports that she has been feeling more fatigued but believes that she has been receiving too much sleep.  Patient denies the need for dosage adjustments at this time and will continue taking her medication as prescribed.  Collaboration of Care: Collaboration of Care: Medication Management AEB provider managing patient's psychiatric medications and Psychiatrist AEB patient being followed by a mental health provider  Patient/Guardian was advised Release of Information must be obtained prior to any record release in order to collaborate their care with an outside provider. Patient/Guardian was advised if they have not already done so to contact the registration department to sign all necessary forms in order for [facility] to release information regarding their care.   Consent: Patient/Guardian gives verbal consent for treatment and assignment of benefits for services provided during this visit. Patient/Guardian expressed understanding and agreed to proceed.   1. Mild episode of recurrent major depressive disorder (HCC) Patient to continue taking venlafaxine 150 mg daily for the management of her major depressive disorder  Patient to follow up in 3 months Provider spent a total of 13 minutes with the patient/reviewing patient's chart  [place], PA 09/08/2022, 12:05 PM

## 2022-09-09 ENCOUNTER — Encounter (HOSPITAL_COMMUNITY): Payer: Self-pay | Admitting: Physician Assistant

## 2022-09-09 MED ORDER — VENLAFAXINE HCL ER 150 MG PO CP24: 150.0000 mg | ORAL_CAPSULE | Freq: Every day | ORAL | 3 refills | Status: DC

## 2022-12-08 ENCOUNTER — Telehealth (HOSPITAL_COMMUNITY): Payer: No Payment, Other | Admitting: Physician Assistant

## 2022-12-10 ENCOUNTER — Telehealth (HOSPITAL_COMMUNITY): Payer: Self-pay | Admitting: *Deleted

## 2022-12-10 NOTE — Telephone Encounter (Signed)
Prior auth request made from [employer] for her Venalfaxine, I spoke with [employer] and she doesn't require a PA for this med now that she [facility] has expanded MCD. Pharmacy notified.

## 2022-12-25 ENCOUNTER — Telehealth (HOSPITAL_COMMUNITY): Payer: No Payment, Other | Admitting: Psychiatry

## 2022-12-25 DIAGNOSIS — Z91199 Patient's noncompliance with other medical treatment and regimen due to unspecified reason: Secondary | ICD-10-CM

## 2022-12-25 NOTE — Progress Notes (Signed)
Called patient for video appointment. She states that her internet connection is slow so she won't be able to log in. She has refills for now and will reschedule appointment.   Karsten Ro, MD PGY3 Psychiatry Resident  [facility]

## 2022-12-29 ENCOUNTER — Encounter (HOSPITAL_COMMUNITY): Payer: Self-pay | Admitting: Psychiatry

## 2023-01-29 ENCOUNTER — Telehealth (INDEPENDENT_AMBULATORY_CARE_PROVIDER_SITE_OTHER): Payer: Medicaid Other | Admitting: Physician Assistant

## 2023-01-29 ENCOUNTER — Encounter (HOSPITAL_COMMUNITY): Payer: Self-pay | Admitting: Physician Assistant

## 2023-01-29 DIAGNOSIS — F411 Generalized anxiety disorder: Secondary | ICD-10-CM

## 2023-01-29 DIAGNOSIS — F330 Major depressive disorder, recurrent, mild: Secondary | ICD-10-CM

## 2023-01-29 MED ORDER — HYDROXYZINE HCL 10 MG PO TABS: 10.0000 mg | ORAL_TABLET | Freq: Three times a day (TID) | ORAL | 1 refills | Status: DC | PRN

## 2023-01-29 MED ORDER — VENLAFAXINE HCL ER 150 MG PO CP24: 150.0000 mg | ORAL_CAPSULE | Freq: Every day | ORAL | 3 refills | Status: DC

## 2023-01-29 NOTE — Progress Notes (Unsigned)
BH MD/PA/NP OP Progress Note  Virtual Visit via Video Note  I connected with [name] on 01/29/23 at  4:30 PM EST by a video enabled telemedicine application and verified that I am speaking with the correct person using two identifiers.  Location: Patient: Home Provider: Clinic   I discussed the limitations of evaluation and management by telemedicine and the availability of in person appointments. The patient expressed understanding and agreed to proceed.  Follow Up Instructions:  I discussed the assessment and treatment plan with the patient. The patient was provided an opportunity to ask questions and all were answered. The patient agreed with the plan and demonstrated an understanding of the instructions.   The patient was advised to call back or seek an in-person evaluation if the symptoms worsen or if the condition fails to improve as anticipated.  I provided 15 minutes of non-face-to-face time during this encounter.  Malachy Mood, PA    01/29/2023 8:07 PM GRETHEL ZENK  MRN:  [id number]  Chief Complaint:  Chief Complaint  Patient presents with   Follow-up   Medication Refill   Medication Management   HPI: ***  Yacine B. Lesli Albee  Visit Diagnosis:    ICD-10-CM   1. Generalized anxiety disorder  F41.1 hydrOXYzine (ATARAX) 10 MG tablet    2. Mild episode of recurrent major depressive disorder (HCC)  F33.0 venlafaxine XR (EFFEXOR-XR) 150 MG 24 hr capsule      Past Psychiatric History:  Major depressive disorder Generalized anxiety disorder  Past Medical History:  Past Medical History:  Diagnosis Date   Anxiety    Depression    Medical history non-contributory     Past Surgical History:  Procedure Laterality Date   TYMPANOSTOMY TUBE PLACEMENT      Family Psychiatric History:  None  Family History: History reviewed. No pertinent family history.  Social History:  Social History   Socioeconomic History   Marital status: Significant Other     Spouse name: Not on file   Number of children: Not on file   Years of education: Not on file   Highest education level: Not on file  Occupational History   Not on file  Tobacco Use   Smoking status: Never   Smokeless tobacco: Never  Vaping Use   Vaping Use: Never used  Substance and Sexual Activity   Alcohol use: No   Drug use: No   Sexual activity: Never  Other Topics Concern   Not on file  Social History Narrative   Not on file   Social Determinants of Health   Financial Resource Strain: Not on file  Food Insecurity: Not on file  Transportation Needs: Not on file  Physical Activity: Not on file  Stress: Not on file  Social Connections: Not on file    Allergies: No Known Allergies  Metabolic Disorder Labs: Lab Results  Component Value Date   HGBA1C 4.7 (L) 03/06/2021   MPG 88.19 03/06/2021   MPG 105 03/25/2015   No results found for: "PROLACTIN" No results found for: "CHOL", "TRIG", "HDL", "CHOLHDL", "VLDL", "LDLCALC" Lab Results  Component Value Date   TSH 1.864 04/08/2020   TSH 4.100 03/25/2015    Therapeutic Level Labs: No results found for: "LITHIUM" No results found for: "VALPROATE" No results found for: "CBMZ"  Current Medications: Current Outpatient Medications  Medication Sig Dispense Refill   hydrOXYzine (ATARAX) 10 MG tablet Take 1 tablet (10 mg total) by mouth 3 (three) times daily as needed. 75 tablet  1   ondansetron (ZOFRAN ODT) 4 MG disintegrating tablet Take 1 tablet (4 mg total) by mouth every 8 (eight) hours as needed for up to 10 doses for nausea or vomiting. 10 tablet 0   traZODone (DESYREL) 50 MG tablet Take 25-50 mg by mouth at bedtime as needed for sleep.     venlafaxine XR (EFFEXOR-XR) 150 MG 24 hr capsule Take 1 capsule (150 mg total) by mouth daily. 30 capsule 3   No current facility-administered medications for this visit.     Musculoskeletal: Strength & Muscle Tone: within normal limits Gait & Station: normal Patient  leans: N/A  Psychiatric Specialty Exam: Review of Systems  Psychiatric/Behavioral:  Negative for decreased concentration, dysphoric mood, hallucinations, self-injury, sleep disturbance and suicidal ideas. The patient is nervous/anxious. The patient is not hyperactive.     There were no vitals taken for this visit.There is no height or weight on file to calculate BMI.  General Appearance: Casual  Eye Contact:  Good  Speech:  Clear and Coherent and Normal Rate  Volume:  Normal  Mood:  Anxious and Euthymic  Affect:  Appropriate  Thought Process:  Coherent, Goal Directed, and Descriptions of Associations: Intact  Orientation:  Full (Time, Place, and Person)  Thought Content: WDL   Suicidal Thoughts:  No  Homicidal Thoughts:  No  Memory:  Immediate;   Good Recent;   Good Remote;   Good  Judgement:  Good  Insight:  Good  Psychomotor Activity:  Normal  Concentration:  Concentration: Good and Attention Span: Good  Recall:  Good  Fund of Knowledge: Good  Language: Good  Akathisia:  No  Handed:  Right  AIMS (if indicated): not done  Assets:  Communication Skills Desire for Improvement Housing Social Support Vocational/Educational  ADL's:  Intact  Cognition: WNL  Sleep:  Good   Screenings: AIMS    Flowsheet Row Admission (Discharged) from 04/08/2020 in [place] [facility] Admission (Discharged) from 03/24/2015 in [facility] Total Score 0 0      AUDIT    Flowsheet Row Admission (Discharged) from 04/08/2020 in [place] [facility] Admission (Discharged) from 03/24/2015 in [place] [facility]  Alcohol Use Disorder Identification Test Final Score (AUDIT) 0 0      GAD-7    Flowsheet Row Video Visit from 01/29/2023 in [facility] Video Visit from 09/08/2022 in [facility] Video Visit from 04/09/2022 in [facility] Video Visit from [facility]/08/2022 [facility] Video Visit from 11/05/2021 in [facility]  Total GAD-7 Score 11 10 3 1 1       PHQ2-9    Flowsheet Row Video Visit from 01/29/2023 in [facility] Video Visit from 09/08/2022 in [facility] Video Visit from 04/09/2022 in [facility] Video Visit from 01/08/2022 in [facility] Video Visit from 11/05/2021 in [facility]  PHQ-2 Total Score 1 1 2  0 1  PHQ-9 Total Score -- -- 9 -- --      Flowsheet Row Video Visit from 01/29/2023 in [facility] Video Visit from 09/08/2022 in [facility] Video Visit from 04/09/2022 in [place]  and Plan: ***    Collaboration of Care: Collaboration of Care: Medication Management AEB provider managing patient's psychiatric medications and Psychiatrist AEB patient being seen by clinical social worker at the school that  Patient/Guardian was advised Release of Information must be obtained prior to any record release in order to collaborate their care with an outside provider. Patient/Guardian was advised if they have not already done so to contact the registration department to sign all necessary forms in order for [place] to release information regarding their care.   Consent: Patient/Guardian gives verbal consent for treatment and assignment of benefits for services provided during this visit. Patient/Guardian expressed understanding and agreed to proceed.   1. Mild episode of recurrent major depressive disorder (HCC)  - venlafaxine XR (EFFEXOR-XR) 150 MG 24 hr capsule; Take 1 capsule (150 mg total) by mouth daily.  Dispense: 30 capsule; Refill:  3  2. Generalized anxiety disorder  - hydrOXYzine (ATARAX) 10 MG tablet; Take 1 tablet (10 mg total) by mouth 3 (three) times daily as needed.  Dispense: 75 tablet; Refill: 1  Patient to follow-up in 6 weeks Provider spent a total of 15 minutes with the patient/reviewing patient's chart  Malachy Mood, PA 01/29/2023, 8:07 PM

## 2023-01-31 NOTE — Progress Notes (Signed)
BH MD/PA/NP OP Progress Note  Virtual Visit via Video Note  I connected with [name] on 01/31/23 at  4:30 PM EST by a video enabled telemedicine application and verified that I am speaking with the correct person using two identifiers.  Location: Patient: Home Provider: Clinic   I discussed the limitations of evaluation and management by telemedicine and the availability of in person appointments. The patient expressed understanding and agreed to proceed.  Follow Up Instructions:  I discussed the assessment and treatment plan with the patient. The patient was provided an opportunity to ask questions and all were answered. The patient agreed with the plan and demonstrated an understanding of the instructions.   The patient was advised to call back or seek an in-person evaluation if the symptoms worsen or if the condition fails to improve as anticipated.  I provided 15 minutes of non-face-to-face time during this encounter.  Malachy Mood, PA    01/31/2023 3:27 PM [name]  MRN:  [id number]  Chief Complaint:  Chief Complaint  Patient presents with   Follow-up   Medication Refill   Medication Management   HPI:   [name] is a 27 year old, Caucasian female with a past psychiatric history significant for major depressive disorder and anxiety who presents to [facility] for follow-up and medication management.  Patient was last seen by this provider on 09/08/2022.  During her last encounter, patient was being managed on the following psychiatric medication: Venlafaxine XR 150 mg daily.  Patient denies issues with the use of her venlafaxine.  She does note that whenever she misses a dose of her medication, the withdrawal symptoms are intense.  Patient would like to continue taking her venlafaxine but notes that her medication has not been as helpful with her anxiety and still continues to experience a good bit of it.  Patient  rates her current anxiety a 3 out of 10 but states that her anxiety is normally a 7 out of 10.    Patient denies any specific triggers to her anxiety but states she has anxiety over the fear death.  Patient believes that she has anxiety over the fear death due to experiencing a lot of losses in her life.  Patient reports that her anxiety usually occurs at night and denies any other factors related to her anxiety.  Patient denies that her anxiety prevents her from sleeping and states that they happen in moments.  A GAD-7 screen was performed with the patient scoring an 11.  Patient is alert and oriented x 4, calm, cooperative, and fully engaged in conversation during the encounter.  Patient endorses all right mood.  Patient denies suicidal or homicidal ideations.  She further denies auditory or visual hallucinations and does not appear to be responding to internal/external stimuli.  Patient endorses good sleep and receives on average 8 to 9 hours of sleep each night.  Patient endorses good appetite and eats on average 2-3 meals per day.  Patient denies alcohol consumption, tobacco use, and illicit drug use.  Visit Diagnosis:    ICD-10-CM   1. Generalized anxiety disorder  F41.1 hydrOXYzine (ATARAX) 10 MG tablet    2. Mild episode of recurrent major depressive disorder (HCC)  F33.0 venlafaxine XR (EFFEXOR-XR) 150 MG 24 hr capsule      Past Psychiatric History:  Major depressive disorder Generalized anxiety disorder  Past Medical History:  Past Medical History:  Diagnosis Date   Anxiety  Depression    Medical history non-contributory     Past Surgical History:  Procedure Laterality Date   TYMPANOSTOMY TUBE PLACEMENT      Family Psychiatric History:  None  Family History: History reviewed. No pertinent family history.  Social History:  Social History   Socioeconomic History   Marital status: Significant Other    Spouse name: Not on file   Number of children: Not on file    Years of education: Not on file   Highest education level: Not on file  Occupational History   Not on file  Tobacco Use   Smoking status: Never   Smokeless tobacco: Never  Vaping Use   Vaping Use: Never used  Substance and Sexual Activity   Alcohol use: No   Drug use: No   Sexual activity: Never  Other Topics Concern   Not on file  Social History Narrative   Not on file   Social Determinants of Health   Financial Resource Strain: Not on file  Food Insecurity: Not on file  Transportation Needs: Not on file  Physical Activity: Not on file  Stress: Not on file  Social Connections: Not on file    Allergies: No Known Allergies  Metabolic Disorder Labs: Lab Results  Component Value Date   HGBA1C 4.7 (L) 03/06/2021   MPG 88.19 03/06/2021   MPG 105 03/25/2015   No results found for: "PROLACTIN" No results found for: "CHOL", "TRIG", "HDL", "CHOLHDL", "VLDL", "LDLCALC" Lab Results  Component Value Date   TSH 1.864 04/08/2020   TSH 4.100 03/25/2015    Therapeutic Level Labs: No results found for: "LITHIUM" No results found for: "VALPROATE" No results found for: "CBMZ"  Current Medications: Current Outpatient Medications  Medication Sig Dispense Refill   hydrOXYzine (ATARAX) 10 MG tablet Take 1 tablet (10 mg total) by mouth 3 (three) times daily as needed. 75 tablet 1   ondansetron (ZOFRAN ODT) 4 MG disintegrating tablet Take 1 tablet (4 mg total) by mouth every 8 (eight) hours as needed for up to 10 doses for nausea or vomiting. 10 tablet 0   traZODone (DESYREL) 50 MG tablet Take 25-50 mg by mouth at bedtime as needed for sleep.     venlafaxine XR (EFFEXOR-XR) 150 MG 24 hr capsule Take 1 capsule (150 mg total) by mouth daily. 30 capsule 3   No current facility-administered medications for this visit.     Musculoskeletal: Strength & Muscle Tone: within normal limits Gait & Station: normal Patient leans: N/A  Psychiatric Specialty Exam: Review of Systems   Psychiatric/Behavioral:  Negative for decreased concentration, dysphoric mood, hallucinations, self-injury, sleep disturbance and suicidal ideas. The patient is nervous/anxious. The patient is not hyperactive.     There were no vitals taken for this visit.There is no height or weight on file to calculate BMI.  General Appearance: Casual  Eye Contact:  Good  Speech:  Clear and Coherent and Normal Rate  Volume:  Normal  Mood:  Anxious and Euthymic  Affect:  Appropriate  Thought Process:  Coherent, Goal Directed, and Descriptions of Associations: Intact  Orientation:  Full (Time, Place, and Person)  Thought Content: WDL   Suicidal Thoughts:  No  Homicidal Thoughts:  No  Memory:  Immediate;   Good Recent;   Good Remote;   Good  Judgement:  Good  Insight:  Good  Psychomotor Activity:  Normal  Concentration:  Concentration: Good and Attention Span: Good  Recall:  Good  Fund of Knowledge: Good  Language: Good  Akathisia:  No  Handed:  Right  AIMS (if indicated): not done  Assets:  Communication Skills Desire for Improvement Housing Social Support Vocational/Educational  ADL's:  Intact  Cognition: WNL  Sleep:  Good   Screenings: AIMS    Flowsheet Row Admission (Discharged) from 04/08/2020 in [place] [facility] Admission (Discharged) from 03/24/2015 in [place] [facility]  AIMS Total Score 0 0      AUDIT    Flowsheet Row Admission (Discharged) from 04/08/2020 in [place] [facility] Admission (Discharged) from 03/24/2015 in [place] [facility]  Alcohol Use Disorder Identification Test Final Score (AUDIT) 0 0      GAD-7    Flowsheet Row Video [facility] from 01/29/2023 in [facility] Video Visit from 09/08/2022 in [facility] Video Visit from 04/09/2022 in [facility] Video Visit from 01/08/2022 in  [facility] Video Visit from 11/05/2021 in [facility]  Total GAD-7 Score 11 10 3 1 1       PHQ2-9    Flowsheet Row Video Visit from 01/29/2023 in [facility] Video Visit from 09/08/2022 in [facility] Video Visit from 04/09/2022 in [facility] Video Visit from 01/08/2022 in [facility] Video Visit from 11/05/2021 in [facility]  PHQ-2 Total Score 1 1 2  0 1  PHQ-9 Total Score -- -- 9 -- --      Flowsheet Row Video Visit from 01/29/2023 in [facility] Video Visit from 09/08/2022 in [facility] Video Visit from 04/09/2022 in [place] and Plan:   [name] is a 27 year old, Caucasian female with a past psychiatric history significant for major depressive disorder and anxiety who presents to [facility] for follow-up and medication management.  Patient presents today stating that she has been having anxiety over the fear of death.  Patient reports that these moments of anxiety happen for a short period of time before she is okay.  Patient is interested in being on a medication for the management of her anxiety.  Provider recommended hydroxyzine 10 mg 3 times daily as needed for the management of her anxiety.  Patient was agreeable to recommendation.  Patient's medications to be e-prescribed to pharmacy of choice.  Collaboration of Care: Collaboration of Care: Medication Management AEB provider managing patient's psychiatric medications and Psychiatrist AEB patient being seen by clinical social worker at the school that  Patient/Guardian was advised Release of Information must be obtained prior to any record  release in order to collaborate their care with an outside provider. Patient/Guardian was advised if they have not already done so to contact the registration department to sign all necessary forms in order for [place] to release information regarding their care.   Consent: Patient/Guardian gives verbal consent for treatment and assignment of benefits for services provided during this visit. Patient/Guardian expressed understanding and agreed to proceed.   1. Mild episode of recurrent major depressive disorder (HCC)  - venlafaxine XR (EFFEXOR-XR) 150 MG 24 hr capsule; Take 1 capsule (150 mg total) by mouth daily.  Dispense: 30 capsule; Refill: 3  2. Generalized anxiety disorder  - hydrOXYzine (ATARAX) 10  MG tablet; Take 1 tablet (10 mg total) by mouth 3 (three) times daily as needed.  Dispense: 75 tablet; Refill: 1  Patient to follow-up in 6 weeks Provider spent a total of 15 minutes with the patient/reviewing patient's chart  Meta Hatchet, PA 01/31/2023, 3:27 PM

## 2023-01-31 NOTE — Progress Notes (Deleted)
BH MD/PA/NP OP Progress Note  Virtual Visit via Video Note  I connected with [name] on 01/31/23 at  4:30 PM EST by a video enabled telemedicine application and verified that I am speaking with the correct person using two identifiers.  Location: Patient: Home Provider: Clinic   I discussed the limitations of evaluation and management by telemedicine and the availability of in person appointments. The patient expressed understanding and agreed to proceed.  Follow Up Instructions:  I discussed the assessment and treatment plan with the patient. The patient was provided an opportunity to ask questions and all were answered. The patient agreed with the plan and demonstrated an understanding of the instructions.   The patient was advised to call back or seek an in-person evaluation if the symptoms worsen or if the condition fails to improve as anticipated.  I provided 15 minutes of non-face-to-face time during this encounter.  Michele Mood, PA    01/31/2023 3:23 PM [name]  MRN:  [id number]  Chief Complaint:  Chief Complaint  Patient presents with   Follow-up   Medication Refill   Medication Management   HPI:   [name] is a 27 year old, Caucasian female with a past psychiatric history significant for major depressive disorder and anxiety who presents to [facility] for follow-up and medication management.  Patient was last seen by this provider on 09/08/2022.  During her last encounter, patient was being managed on the following psychiatric medication: Venlafaxine XR 150 mg daily.  Patient denies issues with the use of her venlafaxine.  She does note that whenever she misses a dose of her medication, the withdrawal symptoms are intense.  Patient would like to continue taking her venlafaxine but notes that her medication has not been as helpful with her anxiety and still continues to experience a good bit of it.  Patient  rates her current anxiety a 3 out of 10 but states that her anxiety is normally a 7 out of 10.    Patient denies any specific triggers to her anxiety but states she has anxiety over the fear death.  Patient believes that she has anxiety over the fear death due to experiencing a lot of losses in her life.  Patient reports that her anxiety usually occurs at night and denies any other factors related to her anxiety.  Patient denies that her anxiety prevents her from sleeping and states that they happen in moments.  A GAD-7 screen was performed with the patient scoring an 11.  Patient is alert and oriented x 4, calm, cooperative, and fully engaged in conversation during the encounter.  Patient endorses all right Baird.  Patient denies suicidal or homicidal ideations.  She further denies auditory or visual hallucinations and does not appear to be responding to internal/external stimuli.  Patient endorses good sleep and receives on average 8 to 9 hours of sleep each night.  Patient endorses good appetite and eats on average 2-3 meals per day.  Patient denies alcohol consumption, tobacco use, and illicit drug use.  Visit Diagnosis:    ICD-10-CM   1. Generalized anxiety disorder  F41.1 hydrOXYzine (ATARAX) 10 MG tablet    2. Mild episode of recurrent major depressive disorder (HCC)  F33.0 venlafaxine XR (EFFEXOR-XR) 150 MG 24 hr capsule      Past Psychiatric History:  Major depressive disorder Generalized anxiety disorder  Past Medical History:  Past Medical History:  Diagnosis Date   Anxiety  Depression    Medical history non-contributory     Past Surgical History:  Procedure Laterality Date   TYMPANOSTOMY TUBE PLACEMENT      Family Psychiatric History:  None  Family History: History reviewed. No pertinent family history.  Social History:  Social History   Socioeconomic History   Marital status: Significant Other    Spouse name: Not on file   Number of children: Not on file    Years of education: Not on file   Highest education level: Not on file  Occupational History   Not on file  Tobacco Use   Smoking status: Never   Smokeless tobacco: Never  Vaping Use   Vaping Use: Never used  Substance and Sexual Activity   Alcohol use: No   Drug use: No   Sexual activity: Never  Other Topics Concern   Not on file  Social History Narrative   Not on file   Social Determinants of Health   Financial Resource Strain: Not on file  Food Insecurity: Not on file  Transportation Needs: Not on file  Physical Activity: Not on file  Stress: Not on file  Social Connections: Not on file    Allergies: No Known Allergies  Metabolic Disorder Labs: Lab Results  Component Value Date   HGBA1C 4.7 (L) 03/06/2021   MPG 88.19 03/06/2021   MPG 105 03/25/2015   No results found for: "PROLACTIN" No results found for: "CHOL", "TRIG", "HDL", "CHOLHDL", "VLDL", "LDLCALC" Lab Results  Component Value Date   TSH 1.864 04/08/2020   TSH 4.100 03/25/2015    Therapeutic Level Labs: No results found for: "LITHIUM" No results found for: "VALPROATE" No results found for: "CBMZ"  Current Medications: Current Outpatient Medications  Medication Sig Dispense Refill   hydrOXYzine (ATARAX) 10 MG tablet Take 1 tablet (10 mg total) by mouth 3 (three) times daily as needed. 75 tablet 1   ondansetron (ZOFRAN ODT) 4 MG disintegrating tablet Take 1 tablet (4 mg total) by mouth every 8 (eight) hours as needed for up to 10 doses for nausea or vomiting. 10 tablet 0   traZODone (DESYREL) 50 MG tablet Take 25-50 mg by mouth at bedtime as needed for sleep.     venlafaxine XR (EFFEXOR-XR) 150 MG 24 hr capsule Take 1 capsule (150 mg total) by mouth daily. 30 capsule 3   No current facility-administered medications for this visit.     Musculoskeletal: Strength & Muscle Tone: within normal limits Gait & Station: normal Patient leans: N/A  Psychiatric Specialty Exam: Review of Systems   Psychiatric/Behavioral:  Negative for decreased concentration, dysphoric Baird, hallucinations, self-injury, sleep disturbance and suicidal ideas. The patient is nervous/anxious. The patient is not hyperactive.     There were no vitals taken for this visit.There is no height or weight on file to calculate BMI.  General Appearance: Casual  Eye Contact:  Good  Speech:  Clear and Coherent and Normal Rate  Volume:  Normal  Baird:  Anxious and Euthymic  Affect:  Appropriate  Thought Process:  Coherent, Goal Directed, and Descriptions of Associations: Intact  Orientation:  Full (Time, Place, and Person)  Thought Content: WDL   Suicidal Thoughts:  No  Homicidal Thoughts:  No  Memory:  Immediate;   Good Recent;   Good Remote;   Good  Judgement:  Good  Insight:  Good  Psychomotor Activity:  Normal  Concentration:  Concentration: Good and Attention Span: Good  Recall:  Good  Fund of Knowledge: Good  Language: Good  Akathisia:  No  Handed:  Right  AIMS (if indicated): not done  Assets:  Communication Skills Desire for Improvement Housing Social Support Vocational/Educational  ADL's:  Intact  Cognition: WNL  Sleep:  Good   Screenings: AIMS    Flowsheet Row Admission (Discharged) from 04/08/2020 in [place] [facility] Admission (Discharged) from 03/24/2015 in [place] [facility]  AIMS Total Score 0 0      AUDIT    Flowsheet Row Admission (Discharged) from 04/08/2020 in [place] [facility] Admission (Discharged) from 03/24/2015 in [place] [facility]  Alcohol Use Disorder Identification Test Final Score (AUDIT) 0 0      GAD-7    Flowsheet Row Video Visit from 01/29/2023 in [facility] Video Visit from 09/08/2022 in [facility] Video Visit from 04/09/2022 in [facility] Video Visit from 01/08/2022 in  [facility] Video Visit from 11/05/2021 in [facility]  Total GAD-7 Score 11 10 3 1 1       PHQ2-9    Flowsheet Row Video Visit from 01/29/2023 in [facility] Video Visit from 09/08/2022 in [facility] Video Visit from 04/09/2022 in [facility] Video Visit from 01/08/2022 in [facility] Video Visit from 11/05/2021 in [facility]  PHQ-2 Total Score 1 1 2  0 1  PHQ-9 Total Score -- -- 9 -- --      Flowsheet Row Video Visit from 01/29/2023 in [facility] Video Visit from 09/08/2022 in [facility] Video Visit from 04/09/2022 in [place] and Plan:   [name] is a 27 year old, Caucasian female with a past psychiatric history significant for major depressive disorder and anxiety who presents to [facility] for follow-up and medication management.  Patient presents today stating that she has been having anxiety over the fear of death.  Patient reports that these moments of anxiety happen for a short period of time before she is okay.  Patient is interested in being on a medication for the management of her anxiety.  Provider recommended hydroxyzine 10 mg 3 times daily as needed for the management of her anxiety.  Patient was agreeable to recommendation.  Patient's medications to be e-prescribed to pharmacy of choice.  Collaboration of Care: Collaboration of Care: Medication Management AEB provider managing patient's psychiatric medications and Psychiatrist AEB patient being seen by clinical social worker at the school that  Patient/Guardian was advised Release of Information must be obtained prior to any record  release in order to collaborate their care with an outside provider. Patient/Guardian was advised if they have not already done so to contact the registration department to sign all necessary forms in order for [place] to release information regarding their care.   Consent: Patient/Guardian gives verbal consent for treatment and assignment of benefits for services provided during this visit. Patient/Guardian expressed understanding and agreed to proceed.   1. Mild episode of recurrent major depressive disorder (HCC)  - venlafaxine XR (EFFEXOR-XR) 150 MG 24 hr capsule; Take 1 capsule (150 mg total) by mouth daily.  Dispense: 30 capsule; Refill: 3  2. Generalized anxiety disorder  - hydrOXYzine (ATARAX) 10  MG tablet; Take 1 tablet (10 mg total) by mouth 3 (three) times daily as needed.  Dispense: 75 tablet; Refill: 1  Patient to follow-up in 6 weeks Provider spent a total of 15 minutes with the patient/reviewing patient's chart  Michele Mood, PA 01/31/2023, 3:23 PM

## 2023-03-09 ENCOUNTER — Encounter (HOSPITAL_COMMUNITY): Payer: Self-pay | Admitting: Physician Assistant

## 2023-03-09 ENCOUNTER — Telehealth (INDEPENDENT_AMBULATORY_CARE_PROVIDER_SITE_OTHER): Payer: Medicaid Other | Admitting: Physician Assistant

## 2023-03-09 DIAGNOSIS — F411 Generalized anxiety disorder: Secondary | ICD-10-CM | POA: Diagnosis not present

## 2023-03-09 DIAGNOSIS — F330 Major depressive disorder, recurrent, mild: Secondary | ICD-10-CM

## 2023-03-09 NOTE — Progress Notes (Unsigned)
[place] MD/[name]/NP OP Progress Note  Virtual Visit via Video Note  I connected with [name] on 03/09/23 at  1:00 PM EDT by a video enabled telemedicine application and verified that I am speaking with the correct person using two identifiers.  Location: Patient: Home Provider: Clinic   I discussed the limitations of evaluation and management by telemedicine and the availability of in person appointments. The patient expressed understanding and agreed to proceed.  Follow Up Instructions:  I discussed the assessment and treatment plan with the patient. The patient was provided an opportunity to ask questions and all were answered. The patient agreed with the plan and demonstrated an understanding of the instructions.   The patient was advised to call back or seek an in-person evaluation if the symptoms worsen or if the condition fails to improve as anticipated.  I provided 9 minutes of non-face-to-face time during this encounter.  Michele Mood, [name]    03/09/2023 5:42 PM [name]  MRN:  [id number]  Chief Complaint:  Chief Complaint  Patient presents with   Follow-up   HPI:   [name] is a 27 year old, Caucasian female with a past psychiatric history significant for major depressive disorder and anxiety who presents to [facility] via virtual video visit for follow-up and medication management. ***  ***  ***  Patient is alert and oriented x 4, calm, cooperative, and fully engaged in conversation during the encounter. ***  Visit Diagnosis:  No diagnosis found.   Past Psychiatric History:  Major depressive disorder Generalized anxiety disorder  Past Medical History:  Past Medical History:  Diagnosis Date   Anxiety    Depression    Medical history non-contributory     Past Surgical History:  Procedure Laterality Date   TYMPANOSTOMY TUBE PLACEMENT      Family Psychiatric History:  None  Family History:  History reviewed. No pertinent family history.  Social History:  Social History   Socioeconomic History   Marital status: Significant Other    Spouse name: Not on file   Number of children: Not on file   Years of education: Not on file   Highest education level: Not on file  Occupational History   Not on file  Tobacco Use   Smoking status: Never   Smokeless tobacco: Never  Vaping Use   Vaping Use: Never used  Substance and Sexual Activity   Alcohol use: No   Drug use: No   Sexual activity: Never  Other Topics Concern   Not on file  Social History Narrative   Not on file   Social Determinants of Health   Financial Resource Strain: Not on file  Food Insecurity: Not on file  Transportation Needs: Not on file  Physical Activity: Not on file  Stress: Not on file  Social Connections: Not on file    Allergies: No Known Allergies  Metabolic Disorder Labs: Lab Results  Component Value Date   HGBA1C 4.7 (L) 03/06/2021   MPG 88.19 03/06/2021   MPG 105 03/25/2015   No results found for: "PROLACTIN" No results found for: "CHOL", "TRIG", "HDL", "CHOLHDL", "VLDL", "LDLCALC" Lab Results  Component Value Date   TSH 1.864 04/08/2020   TSH 4.100 03/25/2015    Therapeutic Level Labs: No results found for: "LITHIUM" No results found for: "VALPROATE" No results found for: "CBMZ"  Current Medications: Current Outpatient Medications  Medication Sig Dispense Refill   hydrOXYzine (ATARAX) 10 MG tablet Take 1 tablet (  10 mg total) by mouth 3 (three) times daily as needed. 75 tablet 1   ondansetron (ZOFRAN ODT) 4 MG disintegrating tablet Take 1 tablet (4 mg total) by mouth every 8 (eight) hours as needed for up to 10 doses for nausea or vomiting. 10 tablet 0   traZODone (DESYREL) 50 MG tablet Take 25-50 mg by mouth at bedtime as needed for sleep.     venlafaxine XR (EFFEXOR-XR) 150 MG 24 hr capsule Take 1 capsule (150 mg total) by mouth daily. 30 capsule 3   No current  facility-administered medications for this visit.     Musculoskeletal: Strength & Muscle Tone: within normal limits Gait & Station: normal Patient leans: N/A  Psychiatric Specialty Exam: Review of Systems  Psychiatric/Behavioral:  Negative for decreased concentration, dysphoric Baird, hallucinations, self-injury, sleep disturbance and suicidal ideas. The patient is not nervous/anxious and is not hyperactive.     There were no vitals taken for this visit.There is no height or weight on file to calculate BMI.  General Appearance: Casual  Eye Contact:  Good  Speech:  Clear and Coherent and Normal Rate  Volume:  Normal  Baird:  Euthymic  Affect:  Appropriate  Thought Process:  Coherent, Goal Directed, and Descriptions of Associations: Intact  Orientation:  Full (Time, Place, and Person)  Thought Content: WDL   Suicidal Thoughts:  No  Homicidal Thoughts:  No  Memory:  Immediate;   Good Recent;   Good Remote;   Good  Judgement:  Good  Insight:  Good  Psychomotor Activity:  Normal  Concentration:  Concentration: Good and Attention Span: Good  Recall:  Good  Fund of Knowledge: Good  Language: Good  Akathisia:  No  Handed:  Right  AIMS (if indicated): not done  Assets:  Communication Skills Desire for Improvement Housing Social Support Vocational/Educational  ADL's:  Intact  Cognition: WNL  Sleep:  Good   Screenings: AIMS    Flowsheet Row Admission (Discharged) from 04/08/2020 in [place] [facility] Admission (Discharged) from 03/24/2015 in [place] Total Score 0 0      AUDIT    Flowsheet Row Admission (Discharged) from 04/08/2020 in [place] [facility] Admission (Discharged) from 03/24/2015 in [place] [facility]  Alcohol Use Disorder Identification Test Final Score (AUDIT) 0 0      GAD-7    Flowsheet Row Video Visit from 03/09/2023 in [facility] Video Visit from 01/29/2023 in [facility] Video Visit from 09/08/2022 in [facility] Video Visit from 04/09/2022 in [facility] Video Visit from 01/08/2022 in [facility]  Total GAD-7 Score '4 11 10 3 1      '$ PHQ2-9    Flowsheet Row Video Visit from 03/09/2023 in [facility] Video Visit from 01/29/2023 in [facility] Video Visit from 09/08/2022 in [facility] Video Visit from 04/09/2022 in [facility] Video Visit from 01/08/2022 in [facility]  PHQ-2 Total Score '1 1 1 2 '$ 0  PHQ-9 Total Score -- -- -- 9 --      Flowsheet Row Video Visit from 03/09/2023 in [facility] Video Visit from 01/29/2023 in [facility] Video Visit from 09/08/2022 in [place]  Risk Low Risk        Assessment and Plan:   Shawne B. Haughney is a 27 year old, Caucasian female with a past psychiatric history significant for major depressive disorder and anxiety who presents to [facility] via virtual video visit for follow-up and medication management. ***  Collaboration of Care: Collaboration of Care: Medication Management AEB provider managing patient's psychiatric medications and Psychiatrist AEB patient being seen by clinical social worker at the school that  Patient/Guardian was advised Release of Information must be obtained prior to any record release in order to collaborate their care with an outside provider. Patient/Guardian was advised if they have not already done so to contact the registration department to sign all necessary forms in order for [place] to release information regarding  their care.   Consent: Patient/Guardian gives verbal consent for treatment and assignment of benefits for services provided during this visit. Patient/Guardian expressed understanding and agreed to proceed.   1. Mild episode of recurrent major depressive disorder ([place]) Patient to continue taking venlafaxine XR 150 mg daily for the management of her major depressive disorder  2. Generalized anxiety disorder Patient to continue taking venlafaxine XR 150 mg for the management of her generalized anxiety disorder Patient to continue taking hydroxyzine grams 3 times daily as needed for the management of her generalized anxiety disorder  Patient to follow-up in 2 months Provider spent a total of 9 minutes with the patient/reviewing patient's chart  Michele Mood, [name] 03/09/2023, 5:42 PM

## 2023-05-05 ENCOUNTER — Telehealth (INDEPENDENT_AMBULATORY_CARE_PROVIDER_SITE_OTHER): Payer: Medicaid Other | Admitting: Physician Assistant

## 2023-05-05 DIAGNOSIS — F411 Generalized anxiety disorder: Secondary | ICD-10-CM | POA: Diagnosis not present

## 2023-05-05 DIAGNOSIS — F330 Major depressive disorder, recurrent, mild: Secondary | ICD-10-CM | POA: Diagnosis not present

## 2023-05-05 MED ORDER — BUSPIRONE HCL 7.5 MG PO TABS: 7.5000 mg | ORAL_TABLET | Freq: Two times a day (BID) | ORAL | 2 refills | Status: DC

## 2023-05-05 MED ORDER — VENLAFAXINE HCL ER 150 MG PO CP24: 150.0000 mg | ORAL_CAPSULE | Freq: Every day | ORAL | 2 refills | Status: DC

## 2023-05-06 ENCOUNTER — Encounter (HOSPITAL_COMMUNITY): Payer: Self-pay | Admitting: Physician Assistant

## 2023-05-06 NOTE — Progress Notes (Signed)
[facility] MD/PA/NP OP Progress Note  Virtual Visit via Video Note  I connected with [name] on 05/06/23 at  1:00 PM EDT by a video enabled telemedicine application and verified that I am speaking with the correct person using two identifiers.  Location: Patient: Home Provider: Clinic   I discussed the limitations of evaluation and management by telemedicine and the availability of in person appointments. The patient expressed understanding and agreed to proceed.  Follow Up Instructions:  I discussed the assessment and treatment plan with the patient. The patient was provided an opportunity to ask questions and all were answered. The patient agreed with the plan and demonstrated an understanding of the instructions.   The patient was advised to call back or seek an in-person evaluation if the symptoms worsen or if the condition fails to improve as anticipated.  I provided 10 minutes of non-face-to-face time during this encounter.  Meta Hatchet, PA    05/06/2023 5:04 AM [name]  MRN:  [id number]  Chief Complaint:  Chief Complaint  Patient presents with   Medication Management   Follow-up   HPI:   [name] is a 27 year old, Caucasian female with a past psychiatric history significant for major depressive disorder and anxiety who presents to [facility] via virtual video visit for follow-up and medication management.  Patient is currently being managed on the following psychiatric medications:  Venlafaxine XR 150 mg daily Hydroxyzine 10 mg 3 times daily as needed  Patient reports that she has been doing well since the last encounter.  In regards to her medications, patient reports that her hydroxyzine makes her drowsy and makes it nearly impossible to stay awake to perform her responsibilities.  Patient reports that the medication makes her very sleepy which makes her unable to continue her work for the day.  Patient does  state that the medication helps when experiencing anxiety while trying to sleep.  Patient reports that her use of venlafaxine continues to go well and denies experiencing depression.  Patient endorses anxiety and rates her anxiety at 3 out of 10.  Patient's current triggers to her anxiety include her girlfriend going through issues and being reminded of her mother's passing.  She reports that her mother passed away on 05/12/24eight years ago and the date is a "trigger day" for her.  Patient denies any other issues or concerns at this time.  A GAD-7 screen was performed with the patient scoring a 6.  Patient is alert and oriented x 4, calm, cooperative, and fully engaged in conversation during the encounter.  Patient endorses good mood.  Patient denies suicidal or homicidal ideations.  She further denies auditory or visual hallucinations and does not appear to be responding to internal/external stimuli.  Patient endorses good sleep and receives on average 9 hours of sleep per night.  Patient endorses good appetite and eats on average 2 meals per day.  Patient denies alcohol consumption, tobacco use, and illicit drug use.  Visit Diagnosis:    ICD-10-CM   1. Generalized anxiety disorder  F41.1 busPIRone (BUSPAR) 7.5 MG tablet    2. Mild episode of recurrent major depressive disorder (HCC)  F33.0 venlafaxine XR (EFFEXOR-XR) 150 MG 24 hr capsule       Past Psychiatric History:  Major depressive disorder Generalized anxiety disorder  Past Medical History:  Past Medical History:  Diagnosis Date   Anxiety    Depression    Medical history non-contributory  Past Surgical History:  Procedure Laterality Date   TYMPANOSTOMY TUBE PLACEMENT      Family Psychiatric History:  None  Family History: History reviewed. No pertinent family history.  Social History:  Social History   Socioeconomic History   Marital status: Significant Other    Spouse name: Not on file   Number of children: Not  on file   Years of education: Not on file   Highest education level: Not on file  Occupational History   Not on file  Tobacco Use   Smoking status: Never   Smokeless tobacco: Never  Vaping Use   Vaping Use: Never used  Substance and Sexual Activity   Alcohol use: No   Drug use: No   Sexual activity: Never  Other Topics Concern   Not on file  Social History Narrative   Not on file   Social Determinants of Health   Financial Resource Strain: Not on file  Food Insecurity: Not on file  Transportation Needs: Not on file  Physical Activity: Not on file  Stress: Not on file  Social Connections: Not on file    Allergies: No Known Allergies  Metabolic Disorder Labs: Lab Results  Component Value Date   HGBA1C 4.7 (L) 03/06/2021   MPG 88.19 03/06/2021   MPG 105 03/25/2015   No results found for: "PROLACTIN" No results found for: "CHOL", "TRIG", "HDL", "CHOLHDL", "VLDL", "LDLCALC" Lab Results  Component Value Date   TSH 1.864 04/08/2020   TSH 4.100 03/25/2015    Therapeutic Level Labs: No results found for: "LITHIUM" No results found for: "VALPROATE" No results found for: "CBMZ"  Current Medications: Current Outpatient Medications  Medication Sig Dispense Refill   busPIRone (BUSPAR) 7.5 MG tablet Take 1 tablet (7.5 mg total) by mouth 2 (two) times daily. 60 tablet 2   hydrOXYzine (ATARAX) 10 MG tablet Take 1 tablet (10 mg total) by mouth 3 (three) times daily as needed. 75 tablet 1   ondansetron (ZOFRAN ODT) 4 MG disintegrating tablet Take 1 tablet (4 mg total) by mouth every 8 (eight) hours as needed for up to 10 doses for nausea or vomiting. 10 tablet 0   traZODone (DESYREL) 50 MG tablet Take 25-50 mg by mouth at bedtime as needed for sleep.     venlafaxine XR (EFFEXOR-XR) 150 MG 24 hr capsule Take 1 capsule (150 mg total) by mouth daily. 30 capsule 2   No current facility-administered medications for this visit.     Musculoskeletal: Strength & Muscle Tone:  within normal limits Gait & Station: normal Patient leans: N/A  Psychiatric Specialty Exam: Review of Systems  Psychiatric/Behavioral:  Negative for decreased concentration, dysphoric mood, hallucinations, self-injury, sleep disturbance and suicidal ideas. The patient is not nervous/anxious and is not hyperactive.     There were no vitals taken for this visit.There is no height or weight on file to calculate BMI.  General Appearance: Casual  Eye Contact:  Good  Speech:  Clear and Coherent and Normal Rate  Volume:  Normal  Mood:  Euthymic  Affect:  Appropriate  Thought Process:  Coherent, Goal Directed, and Descriptions of Associations: Intact  Orientation:  Full (Time, Place, and Person)  Thought Content: WDL   Suicidal Thoughts:  No  Homicidal Thoughts:  No  Memory:  Immediate;   Good Recent;   Good Remote;   Good  Judgement:  Good  Insight:  Good  Psychomotor Activity:  Normal  Concentration:  Concentration: Good and Attention Span: Good  Recall:  Good  Fund of Knowledge: Good  Language: Good  Akathisia:  No  Handed:  Right  AIMS (if indicated): not done  Assets:  Communication Skills Desire for Improvement Housing Social Support Vocational/Educational  ADL's:  Intact  Cognition: WNL  Sleep:  Good   Screenings: AIMS    Flowsheet [facility]  AIMS Total Score 0 0      AUDIT    Flowsheet Row [facility]) [facility] ([facility]  Alcohol Use Disorder Identification Test Final Score (AUDIT) 0 0      GAD-7    Flowsheet Row Video Visit from 05/05/2023 in [facility] Video Visit from 03/09/2023 in [facility]  Video Visit from 01/29/2023 in [facility] Video Visit from 09/08/2022 in [facility] Video Visit from 04/09/2022 in [facility]  Total GAD-7 Score 6 4 11 10 3       PHQ2-9    Flowsheet Row Video Visit from 05/05/2023 in [facility] Video Visit from 03/09/2023 in [facility] Video Visit from 01/29/2023 in [facility] Video Visit from 09/08/2022 in [facility] Video Visit from 04/09/2022 in [facility]  PHQ-2 Total Score 0 1 1 1 2   PHQ-9 Total Score -- -- -- -- 9      Flowsheet Row Video Visit from 05/05/2023 in [facility] Video Visit from 03/09/2023 in [facility] Video Visit from 01/29/2023 in [facility]  C-SSRS RISK CATEGORY Low Risk Low Risk Low Risk        Assessment and Plan:   Sophiamarie Knaup. Houghton is a 27 year old, Caucasian female with a past psychiatric history significant for major depressive disorder and anxiety who presents to [facility] via virtual video visit for follow-up and medication management.  Patient reports that her use of hydroxyzine causes drowsiness to the point of not being able to complete her responsibilities.  Patient does report that the medication is helpful when trying to sleep at night.  Patient denies any issues with her venlafaxine at this time and denies the need for dosage adjustments at this time.  Patient denies depression but continues to endorse some anxiety related to stressors in her life.  Provider recommended patient be placed on buspirone 7.5 mg 2 times daily for the management of her anxiety.  Patient was agreeable to recommendation.  Patient's medications to be e-prescribed to pharmacy of choice.  Collaboration  of Care: Collaboration of Care: Medication Management AEB provider managing patient's psychiatric medications and Psychiatrist AEB patient being seen by clinical social worker at the school that  Patient/Guardian was advised Release of Information must be obtained prior to any record release in order to collaborate their care with an outside provider. Patient/Guardian was advised if they have not already done so to contact the registration department to sign all necessary forms in order for [place] to release information regarding their care.   Consent: Patient/Guardian gives verbal consent for treatment and assignment of benefits for services provided during this visit. Patient/Guardian expressed understanding and agreed to proceed.   1. Mild episode of recurrent  major depressive disorder (HCC)  - venlafaxine XR (EFFEXOR-XR) 150 MG 24 hr capsule; Take 1 capsule (150 mg total) by mouth daily.  Dispense: 30 capsule; Refill: 2  2. Generalized anxiety disorder  - busPIRone (BUSPAR) 7.5 MG tablet; Take 1 tablet (7.5 mg total) by mouth 2 (two) times daily.  Dispense: 60 tablet; Refill: 2  Patient to follow-up in 2 months Provider spent a total of 10 minutes with the patient/reviewing patient's chart  Meta Hatchet, PA 05/06/2023, 5:04 AM

## 2023-05-14 ENCOUNTER — Ambulatory Visit
Admission: EM | Admit: 2023-05-14 | Discharge: 2023-05-14 | Disposition: A | Discharge status: Home / Self Care | Payer: Medicaid Other | Attending: Nurse Practitioner | Admitting: Nurse Practitioner

## 2023-05-14 DIAGNOSIS — W5501XA Bitten by cat, initial encounter: Secondary | ICD-10-CM | POA: Diagnosis not present

## 2023-05-14 DIAGNOSIS — S61552A Open bite of left wrist, initial encounter: Secondary | ICD-10-CM

## 2023-05-14 DIAGNOSIS — Z23 Encounter for immunization: Secondary | ICD-10-CM

## 2023-05-14 MED ORDER — ONDANSETRON 4 MG PO TBDP
4.0000 mg | ORAL_TABLET | Freq: Once | ORAL | Status: AC
  Administered 2023-05-14: 4 mg via ORAL

## 2023-05-14 MED ORDER — RABIES VACCINE, PCEC IM SUSR
1.0000 mL | Freq: Once | INTRAMUSCULAR | Status: AC
  Administered 2023-05-14: 1 mL via INTRAMUSCULAR

## 2023-05-14 MED ORDER — RABIES IMMUNE GLOBULIN 150 UNIT/ML IM INJ
20.0000 [IU]/kg | INJECTION | Freq: Once | INTRAMUSCULAR | Status: AC
  Administered 2023-05-14: 1575 [IU]

## 2023-05-14 MED ORDER — AMOXICILLIN-POT CLAVULANATE 875-125 MG PO TABS: 1.0000 | ORAL_TABLET | Freq: Two times a day (BID) | ORAL | 0 refills | Status: DC

## 2023-05-14 NOTE — ED Provider Notes (Signed)
[facility]    CSN: [id number] Arrival date & time: 05/14/23  1434      History   Chief Complaint Chief Complaint  Patient presents with   Animal Bite    HPI [name] is a 27 y.o. female.   HPI  She is in today for a cat bite.  She reports that she is able to take and was trying to get the cat out of the cage when the cat Bit and scratched on her left arm.  She reports that the cats vaccine was out of date 2023.  Brings. 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      Home Medications    Prior to Admission medications   Medication Sig Start Date End Date Taking? Authorizing Provider  amoxicillin-clavulanate (AUGMENTIN) 875-125 MG tablet Take 1 tablet by mouth every 12 (twelve) hours. 05/14/23  Yes York Valliant, Shana Chute, NP  busPIRone (BUSPAR) 7.5 MG tablet Take 1 tablet (7.5 mg total) by mouth 2 (two) times daily. 05/05/23 05/04/24  Meta Hatchet, PA  venlafaxine XR (EFFEXOR-XR) 150 MG 24 hr capsule Take 1 capsule (150 mg total) by mouth daily. 05/05/23   Meta Hatchet, PA    Family History History reviewed. No pertinent family history.  Social History Social History   Tobacco Use   Smoking status: Never   Smokeless tobacco: Never  Vaping Use   Vaping Use: Never used  Substance Use Topics   Alcohol use: No   Drug use: No      Allergies   Patient has no known allergies.   Review of Systems Review of Systems   Physical Exam Triage Vital Signs ED Triage Vitals  Enc Vitals Group     BP 05/14/23 1500 (!) 144/83     Pulse Rate 05/14/23 1500 85     Resp 05/14/23 1500 16     Temp 05/14/23 1500 99.5 F (37.5 C)     Temp Source 05/14/23 1500 Oral     SpO2 05/14/23 1500 98 %     Weight 05/14/23 1503 175 lb 8 oz (79.6 kg)     Height --      Head Circumference --      Peak Flow --      Pain Score 05/14/23 1458 2     Pain Loc --      Pain Edu? --      Excl. in GC? --    No data found.  Updated Vital Signs BP (!) 144/83 (BP Location: Right Arm)   Pulse 85   Temp 99.5 F (37.5 C) (Oral)   Resp 16   Wt 175 lb 8 oz (79.6 kg)   LMP 04/17/2023   SpO2 98%   BMI 31.09 kg/m   Visual Acuity Right Eye Distance:   Left Eye Distance:   Bilateral Distance:  Right Eye Near:   Left Eye Near:    Bilateral Near:     Physical Exam Skin:    General: Skin is warm.     Capillary Refill: Capillary refill takes less than 2 seconds.     Coloration: Skin is pale.     Findings: Abrasion present.     Comments: Left wrist Proximal left hand lateral   Within reasonable   UC Treatments / Results  Labs (all labs ordered are listed, but only abnormal results are displayed) Labs Reviewed - No data to display  EKG   Radiology No results found.  Procedures Procedures (including critical care time)  Medications Ordered in UC Medications  rabies immune globulin (HYPERRAB/KEDRAB) injection 1,575 Units (has no administration in time range)  rabies vaccine (RABAVERT) injection 1 mL (has no administration in time range)  ondansetron (ZOFRAN-ODT) disintegrating tablet 4 mg (has no administration in time range)    Initial Impression / Assessment and Plan / UC Course  I have reviewed the triage vital signs and the nursing notes.  Pertinent labs & imaging results that were available during my care of  the patient were reviewed by me and considered in my medical decision making (see chart for details).     Cat bite  Final Clinical Impressions(s) / UC Diagnoses   Final diagnoses:  Cat bite of left wrist, initial encounter     Discharge Instructions      You have been provided with a rabies immunoglobulin and vaccine.  You are also given some Zofran.  You have been prescribed Augmentin 875 125 milligrams twice daily for 7 days please complete the antibiotic as directed.  You will follow-up in 3 days for repeat rabies vaccination.     ED Prescriptions     Medication Sig Dispense Auth. Provider   amoxicillin-clavulanate (AUGMENTIN) 875-125 MG tablet Take 1 tablet by mouth every 12 (twelve) hours. 14 tablet Barbette Merino, NP      PDMP not reviewed this encounter.   Thad Ranger [place], [place] 05/14/23 1610

## 2023-05-14 NOTE — Discharge Instructions (Addendum)
You have been provided with a rabies immunoglobulin and vaccine.  You are also given some Zofran.  You have been prescribed Augmentin 875 125 milligrams twice daily for 7 days please complete the antibiotic as directed.  You will follow-up in 3 days for repeat rabies vaccination.

## 2023-05-14 NOTE — ED Triage Notes (Addendum)
Pt presents to UC w/ c/o cat bite on left wrist which occurred today. No bleeding at site. 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

## 2023-05-17 ENCOUNTER — Ambulatory Visit
Admission: EM | Admit: 2023-05-17 | Discharge: 2023-05-17 | Disposition: A | Discharge status: Home / Self Care | Payer: Medicaid Other | Attending: Nurse Practitioner | Admitting: Nurse Practitioner

## 2023-05-17 DIAGNOSIS — Z203 Contact with and (suspected) exposure to rabies: Secondary | ICD-10-CM | POA: Diagnosis not present

## 2023-05-17 DIAGNOSIS — T148XXA Other injury of unspecified body region, initial encounter: Secondary | ICD-10-CM

## 2023-05-17 MED ORDER — RABIES VACCINE, PCEC IM SUSR
1.0000 mL | Freq: Once | INTRAMUSCULAR | Status: AC
  Administered 2023-05-17: 1 mL via INTRAMUSCULAR

## 2023-05-17 NOTE — ED Triage Notes (Signed)
Pt is here for 2nd dose of rabies vaccinee. Gave shot in right arm pt tolerated well.

## 2023-05-21 ENCOUNTER — Ambulatory Visit
Admission: EM | Admit: 2023-05-21 | Discharge: 2023-05-21 | Disposition: A | Discharge status: Home / Self Care | Payer: Medicaid Other | Attending: Internal Medicine | Admitting: Internal Medicine

## 2023-05-21 ENCOUNTER — Ambulatory Visit: Payer: Medicaid Other

## 2023-05-21 DIAGNOSIS — Z23 Encounter for immunization: Secondary | ICD-10-CM

## 2023-05-21 MED ORDER — RABIES VACCINE, PCEC IM SUSR
1.0000 mL | Freq: Once | INTRAMUSCULAR | Status: AC
  Administered 2023-05-21: 1 mL via INTRAMUSCULAR

## 2023-05-21 NOTE — ED Triage Notes (Signed)
Pt here for 3 dose Rabies vaccine 7 day

## 2023-05-28 ENCOUNTER — Ambulatory Visit
Admission: EM | Admit: 2023-05-28 | Discharge: 2023-05-28 | Disposition: A | Discharge status: Home / Self Care | Payer: Medicaid Other | Attending: Internal Medicine | Admitting: Internal Medicine

## 2023-05-28 VITALS — BP 112/71 | HR 96 | Temp 98.5°F | Resp 18

## 2023-05-28 DIAGNOSIS — Z23 Encounter for immunization: Secondary | ICD-10-CM

## 2023-05-28 DIAGNOSIS — Z203 Contact with and (suspected) exposure to rabies: Secondary | ICD-10-CM

## 2023-05-28 MED ORDER — RABIES VACCINE, PCEC IM SUSR
1.0000 mL | Freq: Once | INTRAMUSCULAR | Status: AC
  Administered 2023-05-28: 1 mL via INTRAMUSCULAR

## 2023-05-28 NOTE — ED Triage Notes (Signed)
Here for 4th rabies vaccine 

## 2023-06-01 ENCOUNTER — Ambulatory Visit
Admission: EM | Admit: 2023-06-01 | Discharge: 2023-06-01 | Disposition: A | Discharge status: Home / Self Care | Payer: Medicaid Other | Attending: Nurse Practitioner | Admitting: Nurse Practitioner

## 2023-06-01 DIAGNOSIS — R112 Nausea with vomiting, unspecified: Secondary | ICD-10-CM

## 2023-06-01 DIAGNOSIS — R197 Diarrhea, unspecified: Secondary | ICD-10-CM

## 2023-06-01 LAB — POCT URINALYSIS DIP (MANUAL ENTRY)
Blood, UA: NEGATIVE
Glucose, UA: NEGATIVE mg/dL
Ketones, POC UA: NEGATIVE mg/dL
Leukocytes, UA: NEGATIVE
Nitrite, UA: NEGATIVE
Protein Ur, POC: NEGATIVE mg/dL
Spec Grav, UA: >=1.03 — AB (ref 1.010–1.025)
Urobilinogen, UA: 0.2 E.U./dL
pH, UA: 6 (ref 5.0–8.0)

## 2023-06-01 MED ORDER — ONDANSETRON 4 MG PO TBDP
4.0000 mg | ORAL_TABLET | Freq: Once | ORAL | Status: AC
  Administered 2023-06-01: 4 mg via ORAL

## 2023-06-01 MED ORDER — ONDANSETRON 4 MG PO TBDP: 4.0000 mg | ORAL_TABLET | Freq: Three times a day (TID) | ORAL | 0 refills | Status: DC | PRN

## 2023-06-01 NOTE — ED Notes (Signed)
Patient able to keep water down. 

## 2023-06-01 NOTE — Discharge Instructions (Addendum)
As we discussed, your symptoms are consistent with acute gastroenteritis, likely caused by a virus.  We gave you Zofran today to help with nausea and allow you to drink fluids.  Please continue this at home every 8 hours as needed.    Continue to push hydration with clear liquids like water or Pedialyte.  If you develop vomiting of fluids despite the Zofran, please go to the ER.  Once you have been able to keep clear fluids down for 12-24 hours, you can resume eating soft/bland foods.  After 24 hours of being able to tolerate soft foods, you can advance to normal diet.

## 2023-06-01 NOTE — ED Provider Notes (Signed)
[facility]    CSN: [id number] Arrival date & time: 06/01/23  4098      History   Chief Complaint No chief complaint on file.   HPI [name] is a 27 y.o. female.   Patient presents today for lower abdominal cramping, diarrhea, and vomiting that began approximately 24 hours ago.  She reports more than 5 episodes of nonbloody diarrhea so far today.  Reports there is some fecal matter in the stool, however it is yellowy/mucousy.  Reports nausea/vomiting started this morning and she has had 1 episode of nonbloody vomiting so far.  Has not attempted to drink further fluids this morning because she threw up her first attempt.  She is also having some intermittent lower abdominal cramping that comes and goes prior to having diarrhea.  Also endorses body aches and chills, however no known fevers.  Reports appetite has been decreased as well.  No loss of taste or smell, recent foreign travel, relevant dietary changes, or fast food.  No known illness in similar contacts, or recent antibiotic use.  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  Has taken Pepto Bismol which was helping with symptoms yesterday however is no longer helping.    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      Home Medications    Prior to Admission medications   Medication Sig Start  Date End Date Taking? Authorizing Provider  ondansetron (ZOFRAN-ODT) 4 MG disintegrating tablet Take 1 tablet (4 mg total) by mouth every 8 (eight) hours as needed for nausea or vomiting. 06/01/23  Yes Valentino Nose, NP  busPIRone (BUSPAR) 7.5 MG tablet Take 1 tablet (7.5 mg total) by mouth 2 (two) times daily. 05/05/23 05/04/24  Meta Hatchet, PA  venlafaxine XR (EFFEXOR-XR) 150 MG 24 hr capsule Take 1 capsule (150 mg total) by mouth daily. 05/05/23   Meta Hatchet, PA    Family History History reviewed. No pertinent family history.  Social History Social History   Tobacco Use   Smoking status: Never   Smokeless tobacco: Never  Vaping Use   Vaping Use: Never used  Substance Use Topics   Alcohol use: No   Drug use: No     Allergies   Patient has no known allergies.   Review of Systems Review of Systems Per HPI  Physical Exam Triage Vital Signs ED Triage Vitals  Enc Vitals Group     BP 06/01/23 0853 129/79     Pulse Rate 06/01/23 0853 82     Resp 06/01/23 0853 16     Temp 06/01/23 0853 98.3 F (36.8 C)     Temp Source 06/01/23 0853 Oral     SpO2 06/01/23 0853 98 %     Weight --      Height --      Head Circumference --  Peak Flow --      Pain Score 06/01/23 0855 3     Pain Loc --      Pain Edu? --      Excl. in GC? --    No data found.  Updated Vital Signs BP 129/79 (BP Location: Right Arm)   Pulse 82   Temp 98.3 F (36.8 C) (Oral)   Resp 16   LMP 05/14/2023 (Exact Date)   SpO2 98%   Visual Acuity Right Eye Distance:   Left Eye Distance:   Bilateral Distance:    Right Eye Near:   Left Eye Near:    Bilateral Near:     Physical Exam Vitals and nursing note reviewed.  Constitutional:      General: She is not in acute distress.    Appearance: Normal appearance. She is not toxic-appearing.  HENT:     Right Ear: Tympanic membrane, ear canal and external ear normal. There is no impacted cerumen.     Left Ear: Tympanic membrane, ear canal  and external ear normal. There is no impacted cerumen.     Mouth/Throat:     Mouth: Mucous membranes are moist.     Pharynx: Oropharynx is clear. No posterior oropharyngeal erythema.  Cardiovascular:     Rate and Rhythm: Normal rate and regular rhythm.  Pulmonary:     Effort: Pulmonary effort is normal. No respiratory distress.     Breath sounds: Normal breath sounds. No wheezing, rhonchi or rales.  Abdominal:     General: Abdomen is flat. Bowel sounds are increased. There is no distension.     Palpations: Abdomen is soft.     Tenderness: There is generalized abdominal tenderness. There is no right CVA tenderness, left CVA tenderness or guarding.  Musculoskeletal:     Cervical back: Normal range of motion.  Lymphadenopathy:     Cervical: No cervical adenopathy.  Skin:    General: Skin is warm and dry.     Capillary Refill: Capillary refill takes less than 2 seconds.     Coloration: Skin is not jaundiced or pale.     Findings: No erythema.  Neurological:     Mental Status: She is alert and oriented to person, place, and time.  Psychiatric:        Behavior: Behavior is cooperative.      UC Treatments / Results  Labs (all labs ordered are listed, but only abnormal results are displayed) Labs Reviewed  POCT URINALYSIS DIP (MANUAL ENTRY) - Abnormal; Notable for the following components:      Result Value   Clarity, UA hazy (*)    Bilirubin, UA small (*)    Spec Grav, UA >=1.030 (*)    All other components within normal limits    EKG   Radiology No results found.  Procedures Procedures (including critical care time)  Medications Ordered in UC Medications  ondansetron (ZOFRAN-ODT) disintegrating tablet 4 mg (4 mg Oral Given 06/01/23 0909)    Initial Impression / Assessment and Plan / UC Course  I have reviewed the triage vital signs and the nursing notes.  Pertinent labs & imaging results that were available during my care of the patient were reviewed by me and  considered in my medical decision making (see chart for details).   Patient is well-appearing, normotensive, afebrile, not tachycardic, not tachypneic, oxygenating well on room air.    1. Nausea, vomiting, and diarrhea Vitals and exam today are reassuring Suspect viral gastroenteritis Zofran 4 mg ODT given  with improvement in nausea and patient able to tolerate sips of water while in urgent care today Continue Zofran as needed at home, push hydration with water/Pedialyte Seek care for persistent or worsening symptoms despite treatment and discussed strict ER precautions with patient  The patient was given the opportunity to ask questions.  All questions answered to their satisfaction.  The patient is in agreement to this plan.    Final Clinical Impressions(s) / UC Diagnoses   Final diagnoses:  Nausea, vomiting, and diarrhea     Discharge Instructions      As we discussed, your symptoms are consistent with acute gastroenteritis, likely caused by a virus.  We gave you Zofran today to help with nausea and allow you to drink fluids.  Please continue this at home every 8 hours as needed.    Continue to push hydration with clear liquids like water or Pedialyte.  If you develop vomiting of fluids despite the Zofran, please go to the ER.  Once you have been able to keep clear fluids down for 12-24 hours, you can resume eating soft/bland foods.  After 24 hours of being able to tolerate soft foods, you can advance to normal diet.     ED Prescriptions     Medication Sig Dispense Auth. Provider   ondansetron (ZOFRAN-ODT) 4 MG disintegrating tablet Take 1 tablet (4 mg total) by mouth every 8 (eight) hours as needed for nausea or vomiting. 20 tablet Valentino Nose, NP      PDMP not reviewed this encounter.   Valentino Nose, NP 06/01/23 1506

## 2023-06-01 NOTE — ED Triage Notes (Signed)
Pt reports she has been having diarrhea , intense lower abdominal  pain, and N/V x 1 day. Took Pepto  but vomits it up. Cannot keep anything down.

## 2023-06-17 ENCOUNTER — Emergency Department (HOSPITAL_COMMUNITY): Payer: Medicaid Other

## 2023-06-17 ENCOUNTER — Emergency Department (HOSPITAL_COMMUNITY)
Admission: EM | Admit: 2023-06-17 | Discharge: 2023-06-17 | Disposition: A | Discharge status: Home / Self Care | Payer: Medicaid Other | Attending: Emergency Medicine | Admitting: Emergency Medicine

## 2023-06-17 ENCOUNTER — Other Ambulatory Visit: Payer: Self-pay

## 2023-06-17 DIAGNOSIS — K659 Peritonitis, unspecified: Secondary | ICD-10-CM | POA: Insufficient documentation

## 2023-06-17 DIAGNOSIS — R1032 Left lower quadrant pain: Secondary | ICD-10-CM | POA: Diagnosis present

## 2023-06-17 DIAGNOSIS — D270 Benign neoplasm of right ovary: Secondary | ICD-10-CM | POA: Diagnosis not present

## 2023-06-17 DIAGNOSIS — K6389 Other specified diseases of intestine: Secondary | ICD-10-CM

## 2023-06-17 LAB — CBC WITH DIFFERENTIAL/PLATELET
Abs Immature Granulocytes: 0.02 10*3/uL (ref 0.00–0.07)
Basophils Absolute: 0 10*3/uL (ref 0.0–0.1)
Basophils Relative: 0 %
Eosinophils Absolute: 0 10*3/uL (ref 0.0–0.5)
Eosinophils Relative: 0 %
HCT: 37.1 % (ref 36.0–46.0)
Hemoglobin: 12.3 g/dL (ref 12.0–15.0)
Immature Granulocytes: 0 %
Lymphocytes Relative: 26 %
Lymphs Abs: 1.5 10*3/uL (ref 0.7–4.0)
MCH: 27.8 pg (ref 26.0–34.0)
MCHC: 33.2 g/dL (ref 30.0–36.0)
MCV: 83.9 fL (ref 80.0–100.0)
Monocytes Absolute: 0.3 10*3/uL (ref 0.1–1.0)
Monocytes Relative: 6 %
Neutro Abs: 3.9 10*3/uL (ref 1.7–7.7)
Neutrophils Relative %: 68 %
Platelets: 312 10*3/uL (ref 150–400)
RBC: 4.42 MIL/uL (ref 3.87–5.11)
RDW: 12.3 % (ref 11.5–15.5)
WBC: 5.7 10*3/uL (ref 4.0–10.5)
nRBC: 0 % (ref 0.0–0.2)

## 2023-06-17 LAB — COMPREHENSIVE METABOLIC PANEL
ALT: 13 U/L (ref 0–44)
AST: 15 U/L (ref 15–41)
Albumin: 3.4 g/dL — ABNORMAL LOW (ref 3.5–5.0)
Alkaline Phosphatase: 42 U/L (ref 38–126)
Anion gap: 8 (ref 5–15)
BUN: 10 mg/dL (ref 6–20)
CO2: 23 mmol/L (ref 22–32)
Calcium: 8.7 mg/dL — ABNORMAL LOW (ref 8.9–10.3)
Chloride: 105 mmol/L (ref 98–111)
Creatinine, Ser: 0.72 mg/dL (ref 0.44–1.00)
GFR, Estimated: >60 mL/min (ref >60)
Glucose, Bld: 96 mg/dL (ref 70–99)
Potassium: 4.2 mmol/L (ref 3.5–5.1)
Sodium: 136 mmol/L (ref 135–145)
Total Bilirubin: 0.3 mg/dL (ref 0.3–1.2)
Total Protein: 6.2 g/dL — ABNORMAL LOW (ref 6.5–8.1)

## 2023-06-17 LAB — URINALYSIS, ROUTINE W REFLEX MICROSCOPIC
Bacteria, UA: NONE SEEN
Bilirubin Urine: NEGATIVE
Glucose, UA: NEGATIVE mg/dL
Ketones, ur: NEGATIVE mg/dL
Leukocytes,Ua: NEGATIVE
Nitrite: NEGATIVE
Protein, ur: NEGATIVE mg/dL
Specific Gravity, Urine: 1.015 (ref 1.005–1.030)
pH: 6 (ref 5.0–8.0)

## 2023-06-17 LAB — LIPASE, BLOOD: Lipase: 28 U/L (ref 11–51)

## 2023-06-17 LAB — I-STAT BETA HCG BLOOD, ED (MC, WL, AP ONLY): I-stat hCG, quantitative: <5 m[IU]/mL (ref <5)

## 2023-06-17 MED ORDER — IOHEXOL 350 MG/ML SOLN
75.0000 mL | Freq: Once | INTRAVENOUS | Status: AC | PRN
  Administered 2023-06-17: 75 mL via INTRAVENOUS

## 2023-06-17 MED ORDER — HYDROCODONE-ACETAMINOPHEN 5-325 MG PO TABS: 1.0000 | ORAL_TABLET | ORAL | 0 refills | Status: DC | PRN

## 2023-06-17 NOTE — ED Notes (Signed)
Patient transported to CT 

## 2023-06-17 NOTE — ED Triage Notes (Addendum)
Pt. Stated, Ive had left abdominal pain for a week. Ive also had some nausea due to the pain. I was seen at [facility] and they gave me 2 days of antibiotics. I started the antibiotics on Tuesday . They said if I continue to go to hospital. When I have gas its so painful but my bowel movements are normal and urinating is normal. This started a week ago. Pt had an [place] at the hospital

## 2023-06-17 NOTE — Discharge Instructions (Addendum)
Your CT scan today shows epiploic appendagitis.  This is painful but not dangerous.  Rarely, you will need more treatment but for now use 1 of Motrin/Advil/ibuprofen/Aleve.  You can take Tylenol as well for pain.  You are being referred to a general surgeon.  This is only if the symptoms or not improving in the next week or so.  Your CT also showed a cyst on the right ovary. This does not appear dangerous, but you should follow up with your OBGYN.  However, if you develop fever, vomiting, new or worsening abdominal pain, or any other new/concerning symptoms then return to the ER or call 911.

## 2023-06-17 NOTE — ED Notes (Signed)
Took patient saline lock out patient is now getting dressed

## 2023-06-17 NOTE — ED Provider Notes (Signed)
[place] [facility] AT [facility] Provider Note   CSN: [id number] Arrival date & time: 06/17/23  4098     History  Chief Complaint  Patient presents with   Abdominal Pain   Nausea    [name] is a 27 y.o. female.  HPI 27 year old female presents with LLQ abdominal pain.  Originally started 6 days ago.  2 days ago went to an outside hospital ER and had a negative pelvic ultrasound besides some physiologic free fluid.  There is no evidence of ovarian torsion or cyst per her and per Care Everywhere.  She was told that she had a UTI though she states she has not had any UTI symptoms.  She was put on Macrobid and has been on this for couple days.  However the pain has not improved and so she was told to come to the ER for a CT scan if it did not get better.  No fevers or chills.  Vomited once yesterday but otherwise has been okay.  No diarrhea or constipation.  When she moves, uses the bathroom (urine or bowel movement) she will notice increased discomfort in her left lower quadrant but is not a particular dysuria or vaginal pain.  She is coming off her menstrual cycle and has not had any abnormal discharge.  Has had a constant partner and has a very low suspicion of STI.  Home Medications Prior to Admission medications   Medication Sig Start Date End Date Taking? Authorizing Provider  busPIRone (BUSPAR) 7.5 MG tablet Take 1 tablet (7.5 mg total) by mouth 2 (two) times daily. 05/05/23 05/04/24  Nwoko, Stephens Shire E, PA  ondansetron (ZOFRAN-ODT) 4 MG disintegrating tablet Take 1 tablet (4 mg total) by mouth every 8 (eight) hours as needed for nausea or vomiting. 06/01/23   Valentino Nose, NP  venlafaxine XR (EFFEXOR-XR) 150 MG 24 hr capsule Take 1 capsule (150 mg total) by mouth daily. 05/05/23   Meta Hatchet, PA      Allergies    Patient has no known allergies.    Review of Systems   Review of Systems  Constitutional:  Negative for chills and fever.   Gastrointestinal:  Positive for abdominal pain and vomiting. Negative for constipation, diarrhea and nausea.  Genitourinary:  Negative for dysuria and vaginal discharge.  Musculoskeletal:  Positive for back pain (mild, typical for her cycle, improving).    Physical Exam Updated Vital Signs BP 121/70   Pulse (!) 57   Temp 98.1 F (36.7 C) (Oral)   Resp 18   Ht 5\' 3"  (1.6 m)   Wt 79.4 kg   LMP 06/14/2023 (Exact Date)   SpO2 99%   BMI 31.00 kg/m  Physical Exam Vitals and nursing note reviewed.  Constitutional:      General: She is not in acute distress.    Appearance: She is well-developed. She is not ill-appearing or diaphoretic.  HENT:     Head: Normocephalic and atraumatic.  Cardiovascular:     Rate and Rhythm: Normal rate and regular rhythm.     Heart sounds: Normal heart sounds.  Pulmonary:     Effort: Pulmonary effort is normal.     Breath sounds: Normal breath sounds.  Abdominal:     Palpations: Abdomen is soft.     Tenderness: There is abdominal tenderness in the left lower quadrant. There is no right CVA tenderness or left CVA tenderness.  Genitourinary:    Comments: Declines pelvic exam Skin:  General: Skin is warm and dry.  Neurological:     Mental Status: She is alert.     ED Results / Procedures / Treatments   Labs (all labs ordered are listed, but only abnormal results are displayed) Labs Reviewed  COMPREHENSIVE METABOLIC PANEL - Abnormal; Notable for the following components:      Result Value   Calcium 8.7 (*)    Total Protein 6.2 (*)    Albumin 3.4 (*)    All other components within normal limits  URINALYSIS, ROUTINE W REFLEX MICROSCOPIC - Abnormal; Notable for the following components:   Hgb urine dipstick MODERATE (*)    All other components within normal limits  LIPASE, BLOOD  CBC WITH DIFFERENTIAL/PLATELET  I-STAT BETA HCG BLOOD, ED ([facility], [facility], [facility] ONLY)  GC/CHLAMYDIA PROBE AMP ([place]) NOT AT [facility]    EKG None  Radiology CT  ABDOMEN PELVIS W CONTRAST  Result Date: 06/17/2023 CLINICAL DATA:  Left lower quadrant abdominal pain. EXAM: CT ABDOMEN AND PELVIS WITH CONTRAST TECHNIQUE: Multidetector CT imaging of the abdomen and pelvis was performed using the standard protocol following bolus administration of intravenous contrast. RADIATION DOSE REDUCTION: This exam was performed according to the departmental dose-optimization program which includes automated exposure control, adjustment of the mA and/or kV according to patient size and/or use of iterative reconstruction technique. CONTRAST:  75mL OMNIPAQUE IOHEXOL 350 MG/ML SOLN COMPARISON:  None Available. FINDINGS: Lower chest: No acute abnormality. Hepatobiliary: No focal liver abnormality is seen. No gallstones, gallbladder wall thickening, or biliary dilatation. Pancreas: Unremarkable. No pancreatic ductal dilatation or surrounding inflammatory changes. Spleen: Normal in size without focal abnormality. Adrenals/Urinary Tract: Adrenal glands are unremarkable. Kidneys are normal, without renal calculi, focal lesion, or hydronephrosis. Bladder is unremarkable. Stomach/Bowel: Stomach is normal. The appendix is visualized and appears normal. No pathologic dilatation of the large or small bowel loops. Sigmoid diverticula identified. Along the sigmoid mesocolon scratch set along the proximal sigmoid mesocolon there are several subjacent fat density structures within high density rim and mild adjacent soft tissue stranding with thickening of the left lower quadrant peritoneal reflection. Mild wall thickening of the adjacent segment of proximal sigmoid colon is identified. Vascular/Lymphatic: No significant vascular findings are present. No enlarged abdominal or pelvic lymph nodes. Reproductive: Uterus appears normal. Right ovary dermoid is I did measuring 3.4 by 1.8 cm. Normal left ovary. Other: There is a small volume of free fluid within the posterior pelvis, nonspecific in a premenopausal  female. No discrete fluid collections identified. Musculoskeletal: No acute or significant osseous findings. IMPRESSION: 1. Examination is positive for acute epiploic appendagitis involving the proximal sigmoid colon. 2. Right ovary dermoid. 3. Small volume of free fluid within the posterior pelvis, nonspecific in a premenopausal female. Electronically Signed   By: Signa Kell M.D.   On: 06/17/2023 09:51    Procedures Procedures    Medications Ordered in ED Medications  iohexol (OMNIPAQUE) 350 MG/ML injection 75 mL (75 mLs Intravenous Contrast Given 06/17/23 1610)    ED Course/ Medical Decision Making/ A&P                             Medical Decision Making Amount and/or Complexity of Data Reviewed Labs: ordered.    Details: Negative pregnancy test.  Normal WBC.  UA unremarkable. Radiology: ordered and independent interpretation performed.    Details: CT with epiploic appendagitis.  Risk Prescription drug management.   CT scan shows epiploic appendagitis which seems  to be in the area of her discomfort.  There is also a dermoid cyst that does not seem to be causing any discomfort today.  Will have her follow-up with OB/GYN for that.  Otherwise, for the epiploic appendagitis there is no indication that she would need antibiotics but to continue anti-inflammatories.  She is asking for something a little stronger for pain as she has been taking some of her girlfriends hydrocodone.  Will give her a short course but discussed that NSAIDs will be most helpful and to follow-up with general surgery if not improving.  Also given return precautions to come back if worsening.        Final Clinical Impression(s) / ED Diagnoses Final diagnoses:  Epiploic appendagitis  Dermoid cyst of ovary, right    Rx / DC Orders ED Discharge Orders     None         Pricilla Loveless, MD 06/17/23 1016

## 2023-06-18 LAB — GC/CHLAMYDIA PROBE AMP (~~LOC~~) NOT AT ARMC
Chlamydia: NEGATIVE
Comment: NEGATIVE
Comment: NORMAL
Neisseria Gonorrhea: NEGATIVE

## 2023-07-26 ENCOUNTER — Ambulatory Visit: Payer: Medicaid Other | Admitting: Family Medicine

## 2023-08-09 ENCOUNTER — Encounter: Payer: Self-pay | Admitting: Family Medicine

## 2023-08-09 ENCOUNTER — Ambulatory Visit (INDEPENDENT_AMBULATORY_CARE_PROVIDER_SITE_OTHER): Payer: MEDICAID | Admitting: Family Medicine

## 2023-08-09 VITALS — BP 115/70 | HR 74 | Temp 97.8°F | Ht 63.0 in | Wt 176.0 lb

## 2023-08-09 DIAGNOSIS — F411 Generalized anxiety disorder: Secondary | ICD-10-CM

## 2023-08-09 DIAGNOSIS — Z809 Family history of malignant neoplasm, unspecified: Secondary | ICD-10-CM

## 2023-08-09 DIAGNOSIS — Z0000 Encounter for general adult medical examination without abnormal findings: Secondary | ICD-10-CM | POA: Insufficient documentation

## 2023-08-09 DIAGNOSIS — Z114 Encounter for screening for human immunodeficiency virus [HIV]: Secondary | ICD-10-CM

## 2023-08-09 DIAGNOSIS — Z1159 Encounter for screening for other viral diseases: Secondary | ICD-10-CM

## 2023-08-09 DIAGNOSIS — E66811 Obesity, class 1: Secondary | ICD-10-CM | POA: Insufficient documentation

## 2023-08-09 DIAGNOSIS — Z13220 Encounter for screening for lipoid disorders: Secondary | ICD-10-CM

## 2023-08-09 DIAGNOSIS — Z113 Encounter for screening for infections with a predominantly sexual mode of transmission: Secondary | ICD-10-CM

## 2023-08-09 DIAGNOSIS — Z1329 Encounter for screening for other suspected endocrine disorder: Secondary | ICD-10-CM

## 2023-08-09 DIAGNOSIS — E669 Obesity, unspecified: Secondary | ICD-10-CM | POA: Diagnosis not present

## 2023-08-09 NOTE — Patient Instructions (Signed)
It was great to meet you today and I'm excited to have you join the [facility] practice. I hope you had a positive experience today! If you feel so inclined, please feel free to recommend our practice to friends and family. Mila Merry, FNP-C

## 2023-08-09 NOTE — Progress Notes (Signed)
New Patient Office Visit  Subjective    Patient ID: [name], female    DOB: 1996-01-09  Age: 27 y.o. MRN: [id number]  CC: No chief complaint on file.   HPI [name] presents to establish care. Oriented to practice routines and expectations. No recent PCP. PMH includes anxiety and depression well controlled on Effexor 150mg  daily.  Cervical CA screening: patient has never had a pap test Tobacco: non-smoker STI: consents to screening Vaccines:  declines Tdap    Outpatient Encounter Medications as of 08/09/2023  Medication Sig   venlafaxine XR (EFFEXOR-XR) 150 MG 24 hr capsule Take 1 capsule (150 mg total) by mouth daily.   [DISCONTINUED] busPIRone (BUSPAR) 7.5 MG tablet Take 1 tablet (7.5 mg total) by mouth 2 (two) times daily.   [DISCONTINUED] HYDROcodone-acetaminophen (NORCO) 5-325 MG tablet Take 1 tablet by mouth every 4 (four) hours as needed for severe pain.   [DISCONTINUED] ondansetron (ZOFRAN-ODT) 4 MG disintegrating tablet Take 1 tablet (4 mg total) by mouth every 8 (eight) hours as needed for nausea or vomiting.   No facility-administered encounter medications on file as of 08/09/2023.    Past Medical History:  Diagnosis Date   Anxiety    Depression    Medical history non-contributory     Past Surgical History:  Procedure Laterality Date   TYMPANOSTOMY TUBE PLACEMENT      History reviewed. No pertinent family history.  Social History   Socioeconomic History   Marital status: Significant Other    Spouse name: Not on file   Number of children: Not on file   Years of education: Not on file   Highest education level: Not on file  Occupational History   Not on file  Tobacco Use   Smoking status: Never   Smokeless tobacco: Never  Vaping Use   Vaping status: Never Used  Substance and Sexual Activity   Alcohol use: No   Drug use: No   Sexual activity: Never  Other Topics Concern   Not on file  Social History Narrative   Not on file    Social Determinants of Health   Financial Resource Strain: Not on file  Food Insecurity: Not on file  Transportation Needs: Not on file  Physical Activity: Not on file  Stress: Not on file  Social Connections: Not on file  Intimate Partner Violence: Not on file    Review of Systems  Constitutional: Negative.   HENT: Negative.    Eyes: Negative.   Respiratory: Negative.    Cardiovascular: Negative.   Gastrointestinal: Negative.   Genitourinary: Negative.   Musculoskeletal: Negative.   Skin: Negative.   Neurological: Negative.   Endo/Heme/Allergies: Negative.   Psychiatric/Behavioral: Negative.    All other systems reviewed and are negative.       Objective    BP 115/70   Pulse 74   Temp 97.8 F (36.6 C) (Oral)   Ht 5\' 3"  (1.6 m)   Wt 176 lb (79.8 kg)   LMP 07/07/2023 (Approximate)   SpO2 98%   BMI 31.18 kg/m   Physical Exam Vitals and nursing note reviewed.  Constitutional:      Appearance: Normal appearance. She is normal weight.  HENT:     Head: Normocephalic and atraumatic.  Skin:    General: Skin is warm and dry.  Neurological:     General: No focal deficit present.     Mental Status: She is alert and oriented to person, place, and time. Mental status is  at baseline.  Psychiatric:        Mood and Affect: Mood normal.        Behavior: Behavior normal.        Thought Content: Thought content normal.        Judgment: Judgment normal.         Assessment & Plan:   Problem List Items Addressed This Visit     Generalized anxiety disorder    Well controlled on Effexor 150mg  daily. Denies SI/HI. She is established with psychiatry.      Obesity (BMI 30.0-34.9) - Primary    Fasting labs done today. Encourage heart healthy diet and 150 minutes moderate intensity exercise weekly with calorie restriction.      Relevant Orders   CBC with Differential/Platelet   COMPLETE METABOLIC PANEL WITH GFR   Lipid panel   TSH   Physical exam, annual     Fasting labs performed today and will return to office for complete physical exam and PAP. Today we reviewed medical history and current concerns.      Relevant Orders   CBC with Differential/Platelet   COMPLETE METABOLIC PANEL WITH GFR   Lipid panel   TSH   Other Visit Diagnoses     Screening for HIV (human immunodeficiency virus)       Relevant Orders   HIV Antibody (routine testing w rflx)   Need for hepatitis C screening test       Relevant Orders   Hepatitis C antibody   Routine screening for STI (sexually transmitted infection)       Relevant Orders   HIV Antibody (routine testing w rflx)   RPR   Family history of cancer       Relevant Orders   Ambulatory referral to Genetics   Screening for lipid disorders       Relevant Orders   Lipid panel   Screening for thyroid disorder       Relevant Orders   TSH       Return for annual physical and PAP.   Park Meo, FNP

## 2023-08-09 NOTE — Assessment & Plan Note (Signed)
Fasting labs done today. Encourage heart healthy diet and 150 minutes moderate intensity exercise weekly with calorie restriction.

## 2023-08-09 NOTE — Assessment & Plan Note (Signed)
Fasting labs performed today and will return to office for complete physical exam and PAP. Today we reviewed medical history and current concerns.

## 2023-08-09 NOTE — Assessment & Plan Note (Signed)
Well controlled on Effexor 150mg  daily. Denies SI/HI. She is established with psychiatry.

## 2023-08-10 LAB — CBC WITH DIFFERENTIAL/PLATELET
Absolute Monocytes: 552 cells/uL (ref 200–950)
Basophils Absolute: 8 cells/uL (ref 0–200)
Basophils Relative: 0.1 %
Eosinophils Absolute: 0 cells/uL — ABNORMAL LOW (ref 15–500)
Eosinophils Relative: 0 %
HCT: 39.4 % (ref 35.0–45.0)
Hemoglobin: 13.1 g/dL (ref 11.7–15.5)
Lymphs Abs: 1984 cells/uL (ref 850–3900)
MCH: 28.7 pg (ref 27.0–33.0)
MCHC: 33.2 g/dL (ref 32.0–36.0)
MCV: 86.4 fL (ref 80.0–100.0)
MPV: 8.9 fL (ref 7.5–12.5)
Monocytes Relative: 6.9 %
Neutro Abs: 5456 cells/uL (ref 1500–7800)
Neutrophils Relative %: 68.2 %
Platelets: 345 10*3/uL (ref 140–400)
RBC: 4.56 10*6/uL (ref 3.80–5.10)
RDW: 12.8 % (ref 11.0–15.0)
Total Lymphocyte: 24.8 %
WBC: 8 10*3/uL (ref 3.8–10.8)

## 2023-08-10 LAB — COMPLETE METABOLIC PANEL WITH GFR
AG Ratio: 1.4 (calc) (ref 1.0–2.5)
ALT: 11 U/L (ref 6–29)
AST: 12 U/L (ref 10–30)
Albumin: 3.9 g/dL (ref 3.6–5.1)
Alkaline phosphatase (APISO): 43 U/L (ref 31–125)
BUN: 11 mg/dL (ref 7–25)
CO2: 27 mmol/L (ref 20–32)
Calcium: 9 mg/dL (ref 8.6–10.2)
Chloride: 104 mmol/L (ref 98–110)
Creat: 0.65 mg/dL (ref 0.50–0.96)
Globulin: 2.8 g/dL (calc) (ref 1.9–3.7)
Glucose, Bld: 85 mg/dL (ref 65–99)
Potassium: 4.7 mmol/L (ref 3.5–5.3)
Sodium: 138 mmol/L (ref 135–146)
Total Bilirubin: 0.4 mg/dL (ref 0.2–1.2)
Total Protein: 6.7 g/dL (ref 6.1–8.1)
eGFR: 124 mL/min/{1.73_m2} (ref >=60)

## 2023-08-10 LAB — LIPID PANEL
Cholesterol: 180 mg/dL (ref <200)
HDL: 55 mg/dL (ref >=50)
LDL Cholesterol (Calc): 111 mg/dL (calc) — ABNORMAL HIGH
Non-HDL Cholesterol (Calc): 125 mg/dL (calc) (ref <130)
Total CHOL/HDL Ratio: 3.3 (calc) (ref <5.0)
Triglycerides: 48 mg/dL (ref <150)

## 2023-08-19 ENCOUNTER — Ambulatory Visit (INDEPENDENT_AMBULATORY_CARE_PROVIDER_SITE_OTHER): Payer: MEDICAID | Admitting: Family Medicine

## 2023-08-19 ENCOUNTER — Encounter: Payer: Self-pay | Admitting: Family Medicine

## 2023-08-19 VITALS — BP 118/74 | HR 76 | Temp 97.5°F | Ht 63.0 in | Wt 175.0 lb

## 2023-08-19 DIAGNOSIS — L409 Psoriasis, unspecified: Secondary | ICD-10-CM | POA: Diagnosis not present

## 2023-08-19 DIAGNOSIS — Z124 Encounter for screening for malignant neoplasm of cervix: Secondary | ICD-10-CM

## 2023-08-19 DIAGNOSIS — E7800 Pure hypercholesterolemia, unspecified: Secondary | ICD-10-CM | POA: Diagnosis not present

## 2023-08-19 DIAGNOSIS — Z113 Encounter for screening for infections with a predominantly sexual mode of transmission: Secondary | ICD-10-CM

## 2023-08-19 DIAGNOSIS — Z0001 Encounter for general adult medical examination with abnormal findings: Secondary | ICD-10-CM | POA: Diagnosis not present

## 2023-08-19 DIAGNOSIS — Z0000 Encounter for general adult medical examination without abnormal findings: Secondary | ICD-10-CM

## 2023-08-19 MED ORDER — DESONIDE 0.05 % EX GEL: Freq: Two times a day (BID) | CUTANEOUS | 0 refills | Status: AC

## 2023-08-19 NOTE — Assessment & Plan Note (Signed)
Recommended consuming a heart healthy diet such as Mediterranean diet or DASH diet with whole grains, fruits, vegetable, fish, lean meats, nuts, and olive oil. Limit sweets and processed foods. I also encourage moderate intensity exercise 150 minutes weekly. This is 3-5 times weekly for 30-50 minutes each session. Goal should be pace of 3 miles/hours, or walking 1.5 miles in 30 minutes.

## 2023-08-19 NOTE — Assessment & Plan Note (Signed)
Routine PAP performed today. Will manage based on results.

## 2023-08-19 NOTE — Assessment & Plan Note (Signed)

## 2023-08-19 NOTE — Assessment & Plan Note (Signed)
Start Desonide BID. Return to office if symptoms persist.

## 2023-08-19 NOTE — Progress Notes (Signed)
Complete physical exam  Patient: [name]   DOB: 02/28/1996   27 y.o. Female  MRN: [id number]  Subjective:    Chief Complaint  Patient presents with   Annual Exam    [name] is a 27 y.o. female who presents today for a complete physical exam. She reports consuming a general diet. The patient does not participate in regular exercise at present. She generally feels well. She reports sleeping well. She does have additional problems to discuss today including patches of dry, flaking, red skin to her scalp and left shin. These have been present for many months, no drainage or itching, has tried OTC creams for psoriasis.   Most recent fall risk assessment:    08/19/2023   11:48 AM  Fall Risk   Falls in the past year? 0  Number falls in past yr: 0  Injury with Fall? 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   Cannabis use disorder, severe, dependence (HCC) 04/08/2020   MDD (major depressive disorder), severe (HCC) 04/08/2020   Major depression, recurrent (HCC) 04/08/2020   Major depressive disorder, recurrent severe without psychotic features (HCC) 03/25/2015   Past Medical History:  Diagnosis Date   Anxiety    Depression    Medical history non-contributory    Past Surgical History:  Procedure Laterality Date   TYMPANOSTOMY TUBE PLACEMENT     Social History   Tobacco Use   Smoking status: Never   Smokeless tobacco: Never  Vaping Use   Vaping status: Never Used  Substance  Use Topics   Alcohol use: No   Drug use: No   No family history on file. No Known Allergies    Patient Care Team: Park Meo, FNP as PCP - General (Family Medicine)   Outpatient Medications Prior to Visit  Medication Sig   venlafaxine XR (EFFEXOR-XR) 150 MG 24 hr capsule Take 1 capsule (150 mg total) by mouth daily.   No facility-administered medications prior to visit.    Review of Systems  Constitutional: Negative.   HENT: Negative.    Eyes: Negative.   Respiratory: Negative.    Cardiovascular: Negative.   Gastrointestinal: Negative.   Genitourinary: Negative.   Musculoskeletal: Negative.   Skin:  Positive for rash.  Neurological: Negative.   Endo/Heme/Allergies: Negative.   Psychiatric/Behavioral:  The patient is nervous/anxious.   All other systems reviewed and are negative.         Objective:     BP 118/74   Pulse 76   Temp (!) 97.5 F (36.4 C) (Oral)   Ht 5\' 3"  (1.6 m)   Wt 175 lb (79.4 kg)   LMP 07/07/2023 (Approximate)   SpO2 96%   BMI 31.00 kg/m  BP Readings from Last 3 Encounters:  08/19/23 118/74  08/09/23 115/70  06/17/23 121/70   Wt Readings from Last 3 Encounters:  08/19/23 175 lb (79.4 kg)  08/09/23 176 lb (79.8 kg)  06/17/23 175 lb (79.4 kg)      Physical Exam Vitals and nursing note reviewed. Exam conducted with a chaperone present.  Constitutional:      Appearance: Normal appearance. She is normal weight.  HENT:     Head: Normocephalic and atraumatic.     Right Ear: Tympanic membrane, ear canal and external ear normal.     Left Ear: Tympanic membrane, ear canal and external ear normal.     Nose: Nose normal.     Mouth/Throat:     Mouth: Mucous membranes are moist.     Pharynx: Oropharynx is clear.  Eyes:     Extraocular Movements: Extraocular movements intact.     Conjunctiva/sclera: Conjunctivae normal.     Pupils: Pupils are equal, round, and reactive to light.  Cardiovascular:     Rate and Rhythm: Normal rate  and regular rhythm.     Pulses: Normal pulses.     Heart sounds: Normal heart sounds.  Pulmonary:     Effort: Pulmonary effort is normal.     Breath sounds: Normal breath sounds.  Abdominal:     General: Bowel sounds are normal.     Palpations: Abdomen is soft.  Genitourinary:    General: Normal vulva.     Vagina: Normal.     Cervix: Normal.     Uterus: Normal.      Adnexa: Right adnexa normal and left adnexa normal.     Rectum: Normal.  Musculoskeletal:        General: Normal range of motion.     Cervical back: Normal range of motion and neck supple.  Skin:    General: Skin is warm and dry.     Capillary Refill: Capillary refill takes less than 2 seconds.  Neurological:     General: No focal deficit present.     Mental Status: She is alert and oriented to person, place, and time. Mental status is at baseline.  Psychiatric:        Mood and Affect: Mood normal.        Behavior: Behavior normal.        Thought Content: Thought content normal.        Judgment: Judgment normal.      No results found for any visits on 08/19/23. Last CBC Lab Results  Component Value Date   WBC 8.0 08/09/2023   HGB 13.1 08/09/2023   HCT 39.4 08/09/2023   MCV 86.4 08/09/2023   MCH 28.7 08/09/2023   RDW 12.8 08/09/2023   PLT 345 08/09/2023   Last metabolic panel Lab Results  Component Value Date   GLUCOSE 85 08/09/2023   NA 138 08/09/2023   K 4.7 08/09/2023   CL 104 08/09/2023   CO2 27 08/09/2023   BUN 11 08/09/2023   CREATININE 0.65 08/09/2023   EGFR 124 08/09/2023   CALCIUM 9.0 08/09/2023   PROT 6.7 08/09/2023   ALBUMIN 3.4 (L) 06/17/2023   BILITOT 0.4 08/09/2023   ALKPHOS 42 06/17/2023   AST 12 08/09/2023   ALT 11 08/09/2023   ANIONGAP 8 06/17/2023   Last lipids Lab Results  Component Value Date   CHOL 180 08/09/2023   HDL 55 08/09/2023   LDLCALC 111 (H) 08/09/2023   TRIG 48 08/09/2023   CHOLHDL 3.3 08/09/2023   Last hemoglobin A1c Lab Results  Component Value  Date   HGBA1C 4.7 (L) 03/06/2021   Last thyroid functions Lab Results  Component Value Date   TSH 1.48 08/09/2023   Last  vitamin D No results found for: "25OHVITD2", "25OHVITD3", "VD25OH" Last vitamin B12 and Folate No results found for: "VITAMINB12", "FOLATE"    Assessment & Plan:    Routine Health Maintenance and Physical Exam  Immunization History  Administered Date(s) Administered   Rabies, IM 05/14/2023, 05/17/2023, 05/21/2023, 05/28/2023    Health Maintenance  Topic Date Due   PAP-Cervical Cytology Screening  Never done   PAP SMEAR-Modifier  Never done   INFLUENZA VACCINE  07/29/2023   COVID-19 Vaccine (1 - 2023-24 season) 08/25/2023 (Originally 08/28/2022)   Hepatitis C Screening  Completed   HIV Screening  Completed   HPV VACCINES  Aged Out   DTaP/Tdap/Td  Discontinued    Discussed health benefits of physical activity, and encouraged her to engage in regular exercise appropriate for her age and condition.  Problem List Items Addressed This Visit     Physical exam, annual - Primary    Today your medical history was reviewed and routine physical exam with labs was performed. Recommend 150 minutes of moderate intensity exercise weekly and consuming a well-balanced diet. Advised to stop smoking if a smoker, avoid smoking if a non-smoker, limit alcohol consumption to 1 drink per day for women and 2 drinks per day for men, and avoid illicit drug use. Counseled on safe sex practices and offered STI testing today. Counseled on the importance of sunscreen use. Counseled in mental health awareness and when to seek medical care. Vaccine maintenance discussed. Appropriate health maintenance items reviewed. Return to office in 1 year for annual physical exam.       Cervical cancer screening    Routine PAP performed today. Will manage based on results.      Relevant Orders   Pap IG and Chlamydia/Gonococcus, NAA   SureSwab Advanced Vaginitis, TMA   Psoriasis    Start  Desonide BID. Return to office if symptoms persist.      Elevated LDL cholesterol level    Recommended consuming a heart healthy diet such as Mediterranean diet or DASH diet with whole grains, fruits, vegetable, fish, lean meats, nuts, and olive oil. Limit sweets and processed foods. I also encourage moderate intensity exercise 150 minutes weekly. This is 3-5 times weekly for 30-50 minutes each session. Goal should be pace of 3 miles/hours, or walking 1.5 miles in 30 minutes.      Other Visit Diagnoses     Routine screening for STI (sexually transmitted infection)       Relevant Orders   SureSwab Advanced Vaginitis, TMA      Return in about 1 year (around 08/18/2024) for annual physical with labs 1 week prior.     Park Meo, FNP

## 2023-08-20 LAB — SURESWAB® ADVANCED VAGINITIS,TMA
CANDIDA SPECIES: NOT DETECTED
Candida glabrata: NOT DETECTED
SURESWAB(R) ADV BACTERIAL VAGINOSIS(BV),TMA: POSITIVE — AB
TRICHOMONAS VAGINALIS (TV),TMA: NOT DETECTED

## 2023-08-25 LAB — PAP IG AND CT-NG NAA
C. trachomatis RNA, TMA: NOT DETECTED
N. gonorrhoeae RNA, TMA: NOT DETECTED

## 2023-08-31 ENCOUNTER — Telehealth: Payer: Self-pay | Admitting: Family Medicine

## 2023-08-31 ENCOUNTER — Telehealth: Payer: Self-pay

## 2023-08-31 NOTE — Telephone Encounter (Signed)
PA-Desonide 0.05% gel was faxed to plan 08/25/23. Will wait for response from insurance.

## 2023-08-31 NOTE — Telephone Encounter (Signed)
Patient called to follow up on script sent 08/19/23 for desonide (DESONATE) 0.05 % gel [[id number]]   Pharmacy confirmed receipt electronically but told patient they haven't received it. Please resend to  [facility] - [place] ([place]), [place] - [address] [place]) [place] [address] Phone: [phone]  Fax: [phone]   Please advise patient with any questions at [phone].

## 2023-10-09 ENCOUNTER — Other Ambulatory Visit (HOSPITAL_COMMUNITY): Payer: Self-pay | Admitting: Physician Assistant

## 2023-10-09 DIAGNOSIS — F330 Major depressive disorder, recurrent, mild: Secondary | ICD-10-CM

## 2023-10-15 ENCOUNTER — Telehealth (INDEPENDENT_AMBULATORY_CARE_PROVIDER_SITE_OTHER): Payer: MEDICAID | Admitting: Physician Assistant

## 2023-10-15 DIAGNOSIS — F330 Major depressive disorder, recurrent, mild: Secondary | ICD-10-CM | POA: Diagnosis not present

## 2023-10-15 MED ORDER — VENLAFAXINE HCL ER 150 MG PO CP24
150.0000 mg | ORAL_CAPSULE | Freq: Every day | ORAL | 3 refills | Status: DC
Start: 2023-10-15 — End: 2024-01-14

## 2023-10-18 ENCOUNTER — Encounter (HOSPITAL_COMMUNITY): Payer: Self-pay | Admitting: Physician Assistant

## 2023-10-18 NOTE — Progress Notes (Signed)
BH MD/PA/NP OP Progress Note  Virtual Visit via Video Note  I connected with [name] on 10/18/23 at 11:30 AM EDT by a video enabled telemedicine application and verified that I am speaking with the correct person using two identifiers.  Location: Patient: Home Provider: Clinic   I discussed the limitations of evaluation and management by telemedicine and the availability of in person appointments. The patient expressed understanding and agreed to proceed.  Follow Up Instructions:  I discussed the assessment and treatment plan with the patient. The patient was provided an opportunity to ask questions and all were answered. The patient agreed with the plan and demonstrated an understanding of the instructions.   The patient was advised to call back or seek an in-person evaluation if the symptoms worsen or if the condition fails to improve as anticipated.  I provided 13 minutes of non-face-to-face time during this encounter.  Meta Hatchet, PA    10/18/2023 6:48 PM [name]  MRN:  [id number]  Chief Complaint:  Chief Complaint  Patient presents with   Follow-up   Medication Refill   HPI:   [name]. Fleisher is a 27 year old, Caucasian female with a past psychiatric history significant for major depressive disorder and anxiety who presents to [facility] via virtual video visit for follow-up and medication management.  Patient is currently being managed on the following psychiatric medications:  Venlafaxine XR 150 mg daily Hydroxyzine 10 mg 3 times daily as needed  Patient reports that she will occasionally experience symptoms of withdrawal even though she has been taking her medication regularly (venlafaxine XR).  In regards to withdrawal symptoms, patient reports that she mainly notices "brain zaps."  In regards to her mood, patient reports that she is doing well when taking her medication.  She reports that any side effects  that she may experience if not significant to the point that it is messing up relationships or interfering with her work.  Patient states that her depression is manageable and rates her depression a 1 out of 10 with 10 being most severe.  Patient endorses manageable anxiety and relates that her anxiety at 2 out of 10.  Patient's main stressors involve both of her grandfathers being diagnosed with cancer.  A GAD-7 screen was performed with the patient scoring a 2.  Patient is alert and oriented x 4, calm, cooperative, and fully engaged in conversation during the encounter.  Patient endorses good mood.  Patient denies suicidal or homicidal ideations.  She further denies auditory or visual hallucinations and does not appear to be responding to internal/external stimuli.  Patient endorses good sleep and receives on average 9 hours of sleep per night.  Patient endorses good appetite and eats on average 3 meals per day.  Patient denies alcohol consumption, tobacco use, or illicit drug use.  Visit Diagnosis:    ICD-10-CM   1. Mild episode of recurrent major depressive disorder (HCC)  F33.0 venlafaxine XR (EFFEXOR-XR) 150 MG 24 hr capsule       Past Psychiatric History:  Major depressive disorder Generalized anxiety disorder  Past Medical History:  Past Medical History:  Diagnosis Date   Anxiety    Depression    Medical history non-contributory     Past Surgical History:  Procedure Laterality Date   TYMPANOSTOMY TUBE PLACEMENT      Family Psychiatric History:  None  Family History: History reviewed. No pertinent family history.  Social History:  Social History  Socioeconomic History   Marital status: Significant Other    Spouse name: Not on file   Number of children: Not on file   Years of education: Not on file   Highest education level: Not on file  Occupational History   Not on file  Tobacco Use   Smoking status: Never   Smokeless tobacco: Never  Vaping Use   Vaping  status: Never Used  Substance and Sexual Activity   Alcohol use: No   Drug use: No   Sexual activity: Never  Other Topics Concern   Not on file  Social History Narrative   Not on file   Social Determinants of Health   Financial Resource Strain: Not on file  Food Insecurity: Not on file  Transportation Needs: Not on file  Physical Activity: Not on file  Stress: Not on file  Social Connections: Not on file    Allergies: No Known Allergies  Metabolic Disorder Labs: Lab Results  Component Value Date   HGBA1C 4.7 (L) 03/06/2021   MPG 88.19 03/06/2021   MPG 105 03/25/2015   No results found for: "PROLACTIN" Lab Results  Component Value Date   CHOL 180 08/09/2023   TRIG 48 08/09/2023   HDL 55 08/09/2023   CHOLHDL 3.3 08/09/2023   LDLCALC 111 (H) 08/09/2023   Lab Results  Component Value Date   TSH 1.48 08/09/2023   TSH 1.864 04/08/2020    Therapeutic Level Labs: No results found for: "LITHIUM" No results found for: "VALPROATE" No results found for: "CBMZ"  Current Medications: Current Outpatient Medications  Medication Sig Dispense Refill   desonide (DESONATE) 0.05 % gel Apply topically 2 (two) times daily. 60 g 0   venlafaxine XR (EFFEXOR-XR) 150 MG 24 hr capsule Take 1 capsule (150 mg total) by mouth daily. 30 capsule 3   No current facility-administered medications for this visit.     Musculoskeletal: Strength & Muscle Tone: within normal limits Gait & Station: normal Patient leans: N/A  Psychiatric Specialty Exam: Review of Systems  Psychiatric/Behavioral:  Negative for decreased concentration, dysphoric mood, hallucinations, self-injury, sleep disturbance and suicidal ideas. The patient is not nervous/anxious and is not hyperactive.     There were no vitals taken for this visit.There is no height or weight on file to calculate BMI.  General Appearance: Casual  Eye Contact:  Good  Speech:  Clear and Coherent and Normal Rate  Volume:  Normal  Mood:   Euthymic  Affect:  Appropriate  Thought Process:  Coherent, Goal Directed, and Descriptions of Associations: Intact  Orientation:  Full (Time, Place, and Person)  Thought Content: WDL   Suicidal Thoughts:  No  Homicidal Thoughts:  No  Memory:  Immediate;   Good Recent;   Good Remote;   Good  Judgement:  Good  Insight:  Good  Psychomotor Activity:  Normal  Concentration:  Concentration: Good and Attention Span: Good  Recall:  Good  Fund of Knowledge: Good  Language: Good  Akathisia:  No  Handed:  Right  AIMS (if indicated): not done  Assets:  Communication Skills Desire for Improvement Housing Social Support Vocational/Educational  ADL's:  Intact  Cognition: WNL  Sleep:  Good   Screenings: AIMS    Flowsheet Row [facility]  AIMS Total Score 0 0      AUDIT    Flowsheet Row [facility]  Alcohol Use Disorder Identification Test Final Score (AUDIT) 0 0      GAD-7    Flowsheet Row Video Visit from 10/15/2023 in [facility] Office Visit from 08/19/2023 in [facility] Office Visit from 08/09/2023 in [facility] Video Visit from 05/05/2023 in [facility] Video Visit from 03/09/2023 in [facility]  Total GAD-7 Score 2 3 3 6 4       PHQ2-9    Flowsheet Row Video Visit from 10/15/2023 in [facility] Office Visit from 08/19/2023 in [facility] Office Visit from 08/09/2023 in [facility] Video Visit from 05/05/2023 in [facility] Video Visit from 03/09/2023 in [facility]  PHQ-2 Total Score 0 2 1 0 1  PHQ-9 Total Score -- 6 5 -- --      Flowsheet Row Video Visit from 10/15/2023 in [facility] ED from 06/17/2023 in [facility] Emergency Department at [facility] ED from 06/01/2023 in [facility] Urgent Care at [facility] RISK CATEGORY Moderate Risk No Risk No Risk        Assessment and Plan:   Lindita B. Buttry is a 27 year old, Caucasian female with a past psychiatric history significant for major depressive disorder and anxiety who presents to [facility] via virtual video visit for follow-up and medication management.  Patient presents to the encounter stating that they have been experiencing withdrawal symptoms from the medication (venlafaxine XR) even though they take their medication regularly.  They deny experiencing any significant adverse side effects to the point that it is messing up relationships or interfering with work.  Patient states that their depression and anxiety have both been manageable at this time.  Patient would like to continue taking her medication as prescribed.  Patient's medications to be e-prescribed through pharmacy of choice.  Collaboration of Care: Collaboration of Care: Medication Management AEB provider managing patient's psychiatric medications and Psychiatrist AEB patient being seen by clinical social worker at the school that  Patient/Guardian was advised Release of Information must be obtained prior to any record release in order to collaborate their care with an outside provider. Patient/Guardian was advised if they have not already done so to contact the registration department to sign all necessary forms in order for [place] to release information regarding their care.   Consent: Patient/Guardian gives verbal consent for treatment and assignment of  benefits for services provided during this visit. Patient/Guardian expressed understanding and agreed to proceed.   1. Mild episode of recurrent major depressive disorder (HCC)  - venlafaxine XR (EFFEXOR-XR) 150 MG 24 hr capsule; Take 1 capsule (150 mg total) by mouth daily.  Dispense: 30 capsule; Refill: 3  Patient to follow-up in 3 months Provider spent a total of 13 minutes with the patient/reviewing patient's chart  Meta Hatchet, PA 10/18/2023, 6:48 PM

## 2024-01-14 ENCOUNTER — Telehealth (INDEPENDENT_AMBULATORY_CARE_PROVIDER_SITE_OTHER): Payer: MEDICAID | Admitting: Physician Assistant

## 2024-01-14 DIAGNOSIS — F330 Major depressive disorder, recurrent, mild: Secondary | ICD-10-CM

## 2024-01-14 MED ORDER — VENLAFAXINE HCL ER 150 MG PO CP24: 150.0000 mg | ORAL_CAPSULE | Freq: Every day | ORAL | 1 refills | Status: DC

## 2024-01-14 NOTE — Progress Notes (Unsigned)
BH MD/PA/NP OP Progress Note  Virtual Visit via Video Note  I connected with [name] on 01/14/24 at  1:00 PM EST by a video enabled telemedicine application and verified that I am speaking with the correct person using two identifiers.  Location: Patient: Home Provider: Clinic   I discussed the limitations of evaluation and management by telemedicine and the availability of in person appointments. The patient expressed understanding and agreed to proceed.  Follow Up Instructions:  I discussed the assessment and treatment plan with the patient. The patient was provided an opportunity to ask questions and all were answered. The patient agreed with the plan and demonstrated an understanding of the instructions.   The patient was advised to call back or seek an in-person evaluation if the symptoms worsen or if the condition fails to improve as anticipated.  I provided 9 minutes of non-face-to-face time during this encounter.  Meta Hatchet, PA    01/14/2024 1:14 PM CHOSEN WADA  MRN:  [id number]  Chief Complaint:  No chief complaint on file.  HPI:   [name]. Sarles is a 28 year old, Caucasian female with a past psychiatric history significant for major depressive disorder and anxiety who presents to [facility] via virtual video visit for follow-up and medication management.  Patient is currently being managed on the following psychiatric medications:  Venlafaxine XR 150 mg daily Hydroxyzine 10 mg 3 times daily as needed  ***  Patient is alert and oriented x 4, calm, cooperative, and fully engaged in conversation during the encounter. ***  Visit Diagnosis:  No diagnosis found.   Past Psychiatric History:  Major depressive disorder Generalized anxiety disorder  Past Medical History:  Past Medical History:  Diagnosis Date   Anxiety    Depression    Medical history non-contributory     Past Surgical History:   Procedure Laterality Date   TYMPANOSTOMY TUBE PLACEMENT      Family Psychiatric History:  None  Family History: No family history on file.  Social History:  Social History   Socioeconomic History   Marital status: Significant Other    Spouse name: Not on file   Number of children: Not on file   Years of education: Not on file   Highest education level: Not on file  Occupational History   Not on file  Tobacco Use   Smoking status: Never   Smokeless tobacco: Never  Vaping Use   Vaping status: Never Used  Substance and Sexual Activity   Alcohol use: No   Drug use: No   Sexual activity: Never  Other Topics Concern   Not on file  Social History Narrative   Not on file   Social Drivers of Health   Financial Resource Strain: Not on file  Food Insecurity: Not on file  Transportation Needs: Not on file  Physical Activity: Not on file  Stress: Not on file  Social Connections: Not on file    Allergies: No Known Allergies  Metabolic Disorder Labs: Lab Results  Component Value Date   HGBA1C 4.7 (L) 03/06/2021   MPG 88.19 03/06/2021   MPG 105 03/25/2015   No results found for: "PROLACTIN" Lab Results  Component Value Date   CHOL 180 08/09/2023   TRIG 48 08/09/2023   HDL 55 08/09/2023   CHOLHDL 3.3 08/09/2023   LDLCALC 111 (H) 08/09/2023   Lab Results  Component Value Date   TSH 1.48 08/09/2023   TSH 1.864 04/08/2020  Therapeutic Level Labs: No results found for: "LITHIUM" No results found for: "VALPROATE" No results found for: "CBMZ"  Current Medications: Current Outpatient Medications  Medication Sig Dispense Refill   desonide (DESONATE) 0.05 % gel Apply topically 2 (two) times daily. 60 g 0   venlafaxine XR (EFFEXOR-XR) 150 MG 24 hr capsule Take 1 capsule (150 mg total) by mouth daily. 30 capsule 3   No current facility-administered medications for this visit.     Musculoskeletal: Strength & Muscle Tone: within normal limits Gait & Station:  normal Patient leans: N/A  Psychiatric Specialty Exam: Review of Systems  Psychiatric/Behavioral:  Negative for decreased concentration, dysphoric mood, hallucinations, self-injury, sleep disturbance and suicidal ideas. The patient is not nervous/anxious and is not hyperactive.     There were no vitals taken for this visit.There is no height or weight on file to calculate BMI.  General Appearance: Casual  Eye Contact:  Good  Speech:  Clear and Coherent and Normal Rate  Volume:  Normal  Mood:  Euthymic  Affect:  Appropriate  Thought Process:  Coherent, Goal Directed, and Descriptions of Associations: Intact  Orientation:  Full (Time, Place, and Person)  Thought Content: WDL   Suicidal Thoughts:  No  Homicidal Thoughts:  No  Memory:  Immediate;   Good Recent;   Good Remote;   Good  Judgement:  Good  Insight:  Good  Psychomotor Activity:  Normal  Concentration:  Concentration: Good and Attention Span: Good  Recall:  Good  Fund of Knowledge: Good  Language: Good  Akathisia:  No  Handed:  Right  AIMS (if indicated): not done  Assets:  Communication Skills Desire for Improvement Housing Social Support Vocational/Educational  ADL's:  Intact  Cognition: WNL  Sleep:  Good   Screenings: AIMS    Flowsheet Row [facility]  AIMS Total Score 0 0      AUDIT    Flowsheet Row [facility]  Alcohol Use Disorder Identification Test Final Score (AUDIT) 0 0      GAD-7    Flowsheet Row Video Visit from 01/14/2024 in [facility] Video Visit from 10/15/2023 in [facility] Office Visit from 08/19/2023 in [facility] Office Visit from 08/09/2023 in [facility] Video Visit from 05/05/2023 in [facility]  Total GAD-7 Score 0 2 3 3 6       PHQ2-9    Flowsheet Row Video Visit from 01/14/2024 in [facility] Video Visit from 10/15/2023 in [facility] Office Visit from 08/19/2023 in [facility] Office Visit from 08/09/2023 in [facility] Video Visit from 05/05/2023 in [facility]  PHQ-2 Total Score 0 0 2 1 0  PHQ-9 Total Score -- -- 6 5 --      Flowsheet Row Video Visit from 01/14/2024 in [facility] Video Visit from 10/15/2023 in [facility] ED from 06/17/2023 in [facility] Emergency Department at [facility]  C-SSRS RISK CATEGORY Moderate Risk Moderate Risk No Risk        Assessment and Plan:   Rediet Zittel. Leone Brand  is a 28 year old, Caucasian female with a past psychiatric history significant for major depressive disorder and anxiety who presents to [facility] via virtual video visit for follow-up and medication management. ***  Collaboration of Care: Collaboration of Care: Medication Management AEB provider managing patient's psychiatric medications and Psychiatrist AEB patient being seen by clinical social worker at the school that  Patient/Guardian was advised Release of Information must be obtained prior to any record release in order to collaborate their care with an outside provider. Patient/Guardian was advised if they have not already done so to contact the registration department to sign all necessary forms in order for [place] to release information regarding their care.   Consent: Patient/Guardian gives verbal consent for treatment and assignment of benefits for services  provided during this visit. Patient/Guardian expressed understanding and agreed to proceed.   *** Patient to follow-up in 3 months Provider spent a total of 13 minutes with the patient/reviewing patient's chart  Meta Hatchet, PA 01/14/2024, 1:14 PM

## 2024-01-15 ENCOUNTER — Encounter (HOSPITAL_COMMUNITY): Payer: Self-pay | Admitting: Physician Assistant

## 2024-07-14 ENCOUNTER — Encounter (HOSPITAL_COMMUNITY): Payer: Self-pay | Admitting: Physician Assistant

## 2024-07-14 ENCOUNTER — Telehealth (HOSPITAL_COMMUNITY): Payer: MEDICAID | Admitting: Physician Assistant

## 2024-07-14 DIAGNOSIS — F330 Major depressive disorder, recurrent, mild: Secondary | ICD-10-CM | POA: Diagnosis not present

## 2024-07-14 MED ORDER — VENLAFAXINE HCL ER 75 MG PO CP24: 75.0000 mg | ORAL_CAPSULE | Freq: Every day | ORAL | 2 refills | Status: DC

## 2024-07-14 NOTE — Progress Notes (Signed)
 BH MD/PA/NP OP Progress Note  Virtual Visit via Video Note  I connected with [name] on 07/14/24 at  1:00 PM EDT by a video enabled telemedicine application and verified that I am speaking with the correct person using two identifiers.  Location: Patient: Home Provider: Clinic   I discussed the limitations of evaluation and management by telemedicine and the availability of in person appointments. The patient expressed understanding and agreed to proceed.  Follow Up Instructions:  I discussed the assessment and treatment plan with the patient. The patient was provided an opportunity to ask questions and all were answered. The patient agreed with the plan and demonstrated an understanding of the instructions.   The patient was advised to call back or seek an in-person evaluation if the symptoms worsen or if the condition fails to improve as anticipated.  I provided 14 minutes of non-face-to-face time during this encounter.  Michele FORBES Bolster, PA    07/14/2024 1:00 PM [name]  MRN:  [id number]  Chief Complaint:  No chief complaint on file.  HPI:   [name] is a 28 year old, Caucasian female with a past psychiatric history significant for major depressive disorder and anxiety who presents to [facility] via virtual video visit for follow-up and medication management.  Patient is currently being managed on the following psychiatric medications: Venlafaxine  XR 150 mg daily.  Patient presents to the encounter stating that she has been experiencing excessive sweating.  She reports that she noticed this symptom last summer and did not think anything of it until now.  She reports that if it reaches about 4 F, then she starts sweating profusely.  Patient denies experiencing any other side effects from her use of venlafaxine .  Patient is interested in adjusting her dosage of venlafaxine  from 150 mg to 75 mg daily to prevent her  sweating.  Patient denies overt depressive symptoms nor does she endorse anxiety.  Patient's current stressor revolves around switching her job most recently.  Patient also reports that she recently lost a close friend.  A PHQ-9 screen was performed with the patient scoring a 0.  A GAD-7 screen was also performed with the patient scoring a 5.  Patient is alert and oriented x 4, calm, cooperative, and fully engaged in conversation during the encounter.  Patient endorses pretty good mood.  Patient exhibits euthymic mood with appropriate affect.  Patient denies suicidal or homicidal ideations.  She further denies auditory or visual hallucinations and does not appear to be responding to internal/external stimuli.  Patient endorses good sleep and receives on average 9 hours of sleep per night.  Patient endorses good appetite and eats on average 2 meals per day.  Patient endorses alcohol consumption on occasion.  Patient denies tobacco use or illicit drug use.  Visit Diagnosis:    ICD-10-CM   1. Mild episode of recurrent major depressive disorder (HCC)  F33.0 venlafaxine  XR (EFFEXOR -XR) 75 MG 24 hr capsule      Past Psychiatric History:  Major depressive disorder Generalized anxiety disorder  Past Medical History:  Past Medical History:  Diagnosis Date   Anxiety    Depression    Medical history non-contributory     Past Surgical History:  Procedure Laterality Date   TYMPANOSTOMY TUBE PLACEMENT      Family Psychiatric History:  None  Family History: No family history on file.  Social History:  Social History   Socioeconomic History   Marital status: Significant Other  Spouse name: Not on file   Number of children: Not on file   Years of education: Not on file   Highest education level: Not on file  Occupational History   Not on file  Tobacco Use   Smoking status: Never   Smokeless tobacco: Never  Vaping Use   Vaping status: Never Used  Substance and Sexual Activity    Alcohol use: No   Drug use: No   Sexual activity: Never  Other Topics Concern   Not on file  Social History Narrative   Not on file   Social Drivers of Health   Financial Resource Strain: Not on file  Food Insecurity: Not on file  Transportation Needs: Not on file  Physical Activity: Not on file  Stress: Not on file  Social Connections: Not on file    Allergies: No Known Allergies  Metabolic Disorder Labs: Lab Results  Component Value Date   HGBA1C 4.7 (L) 03/06/2021   MPG 88.19 03/06/2021   MPG 105 03/25/2015   No results found for: PROLACTIN Lab Results  Component Value Date   CHOL 180 08/09/2023   TRIG 48 08/09/2023   HDL 55 08/09/2023   CHOLHDL 3.3 08/09/2023   LDLCALC 111 (H) 08/09/2023   Lab Results  Component Value Date   TSH 1.48 08/09/2023   TSH 1.864 04/08/2020    Therapeutic Level Labs: No results found for: LITHIUM No results found for: VALPROATE No results found for: CBMZ  Current Medications: Current Outpatient Medications  Medication Sig Dispense Refill   desonide  (DESONATE ) 0.05 % gel Apply topically 2 (two) times daily. 60 g 0   venlafaxine  XR (EFFEXOR -XR) 75 MG 24 hr capsule Take 1 capsule (75 mg total) by mouth daily. 30 capsule 2   No current facility-administered medications for this visit.     Musculoskeletal: Strength & Muscle Tone: within normal limits Gait & Station: normal Patient leans: N/A  Psychiatric Specialty Exam: Review of Systems  Psychiatric/Behavioral:  Negative for decreased concentration, dysphoric mood, hallucinations, self-injury, sleep disturbance and suicidal ideas. The patient is not nervous/anxious and is not hyperactive.     There were no vitals taken for this visit.There is no height or weight on file to calculate BMI.  General Appearance: Casual  Eye Contact:  Good  Speech:  Clear and Coherent and Normal Rate  Volume:  Normal  Mood:  Euthymic  Affect:  Appropriate  Thought Process:   Coherent, Goal Directed, and Descriptions of Associations: Intact  Orientation:  Full (Time, Place, and Person)  Thought Content: WDL   Suicidal Thoughts:  No  Homicidal Thoughts:  No  Memory:  Immediate;   Good Recent;   Good Remote;   Good  Judgement:  Good  Insight:  Good  Psychomotor Activity:  Normal  Concentration:  Concentration: Good and Attention Span: Good  Recall:  Good  Fund of Knowledge: Good  Language: Good  Akathisia:  No  Handed:  Right  AIMS (if indicated): not done  Assets:  Communication Skills Desire for Improvement Housing Social Support Vocational/Educational  ADL's:  Intact  Cognition: WNL  Sleep:  Good   Screenings: AIMS    Flowsheet Row [facility]  AIMS Total Score 0 0   AUDIT    Flowsheet Row [facility]  Alcohol Use Disorder Identification Test Final Score (AUDIT) 0 0   GAD-7    Flowsheet Row Video Visit from 07/14/2024 in [facility] Video Visit from 01/14/2024 in [facility] Video Visit from 10/15/2023 in [facility] Office Visit from 08/19/2023 in [facility] Office Visit from 08/09/2023 in [facility]  Total GAD-7 Score 5 0 2 3 3    PHQ2-9    Flowsheet Row Video Visit from 07/14/2024 in [facility] Video Visit from 01/14/2024 in [facility] Video Visit from 10/15/2023 in [facility] Office Visit from 08/19/2023 in [facility] Office Visit from 08/09/2023 in [facility]  PHQ-2 Total Score 0 0 0 2 1  PHQ-9 Total Score -- -- -- 6 5   Flowsheet Row Video Visit from 07/14/2024 in [facility] Video Visit from 01/14/2024 in [facility] Video Visit from 10/15/2023 in [facility]  C-SSRS RISK CATEGORY Moderate Risk Moderate Risk Moderate Risk     Assessment and Plan:   Helyne Genther. Calaway is a 27 year old, Caucasian female with a past psychiatric history significant for major depressive disorder and anxiety who presents to [facility] via virtual video visit for follow-up and medication management.  Patient presents to the encounter stating that she has been experiencing profuse sweating through her use of venlafaxine  XR.  Patient is interested in adjusting the dosage from 150 mg to 75 mg daily to prevent her sweating.  Provider to adjust patient's medication accordingly.  Patient denies overt depressive symptoms nor does she endorse anxiety.  She does endorse some generalized stressors but states her mood has been mostly stable.  A PHQ-9 screen was performed with the patient scoring a 0.  A GAD-7 screen was also performed with the patient scoring a 5.  Patient's medication to be e-prescribed to pharmacy of choice.  A [place] Suicide Severity Rating Scale was performed with the patient being considered moderate risk.  Patient denies suicidal ideations and is able to contract for safety at this time.  Safety planning was discussed with the patient prior to the conclusion of the encounter.  - Patient was instructed to contact 911 in the event of a mental health crisis. - Patient was instructed to contact [address] in the event of a mental health crisis. - Patient was instructed to present to [facility] in the event of a mental health crisis.  Collaboration of Care:  Collaboration of Care: Medication Management AEB provider managing patient's psychiatric medications and Psychiatrist AEB patient being seen by clinical social worker at the school that  Patient/Guardian was advised Release of Information must be obtained prior to any record release in order to collaborate their care with an outside provider. Patient/Guardian was advised if they have not already done so to contact the registration department to sign all necessary forms in order for us  to release information regarding their care.   Consent: Patient/Guardian gives verbal consent for treatment and assignment of benefits for services provided during this visit. Patient/Guardian expressed understanding and agreed to proceed.   1. Mild episode of recurrent major depressive disorder (HCC)  - venlafaxine  XR (EFFEXOR -XR) 75 MG 24 hr capsule; Take 1 capsule (75 mg total) by  mouth daily.  Dispense: 30 capsule; Refill: 2  Patient to follow-up in 2 months Provider spent a total of 14 minutes with the patient/reviewing patient's chart  Michele FORBES Bolster, PA 07/14/2024, 1:00 PM

## 2024-09-05 ENCOUNTER — Encounter (HOSPITAL_COMMUNITY): Payer: Self-pay | Admitting: *Deleted

## 2024-09-05 ENCOUNTER — Ambulatory Visit (HOSPITAL_COMMUNITY)
Admission: EM | Admit: 2024-09-05 | Discharge: 2024-09-05 | Disposition: A | Discharge status: Home / Self Care | Payer: MEDICAID | Attending: Internal Medicine | Admitting: Internal Medicine

## 2024-09-05 ENCOUNTER — Other Ambulatory Visit: Payer: Self-pay

## 2024-09-05 DIAGNOSIS — J209 Acute bronchitis, unspecified: Secondary | ICD-10-CM | POA: Diagnosis not present

## 2024-09-05 DIAGNOSIS — R0602 Shortness of breath: Secondary | ICD-10-CM

## 2024-09-05 DIAGNOSIS — R051 Acute cough: Secondary | ICD-10-CM

## 2024-09-05 LAB — POC SARS CORONAVIRUS 2 AG -  ED: SARS Coronavirus 2 Ag: NEGATIVE

## 2024-09-05 MED ORDER — ALBUTEROL SULFATE HFA 108 (90 BASE) MCG/ACT IN AERS: 2.0000 | INHALATION_SPRAY | Freq: Four times a day (QID) | RESPIRATORY_TRACT | 0 refills | Status: DC | PRN

## 2024-09-05 MED ORDER — ALBUTEROL SULFATE HFA 108 (90 BASE) MCG/ACT IN AERS
INHALATION_SPRAY | RESPIRATORY_TRACT | Status: AC
  Filled 2024-09-05: qty 6.7

## 2024-09-05 MED ORDER — METHYLPREDNISOLONE SODIUM SUCC 125 MG IJ SOLR
INTRAMUSCULAR | Status: AC
  Filled 2024-09-05: qty 2

## 2024-09-05 MED ORDER — ALBUTEROL SULFATE (2.5 MG/3ML) 0.083% IN NEBU
2.5000 mg | INHALATION_SOLUTION | Freq: Once | RESPIRATORY_TRACT | Status: AC
  Administered 2024-09-05: 2.5 mg via RESPIRATORY_TRACT

## 2024-09-05 MED ORDER — METHYLPREDNISOLONE SODIUM SUCC 125 MG IJ SOLR
80.0000 mg | Freq: Once | INTRAMUSCULAR | Status: AC
  Administered 2024-09-05: 80 mg via INTRAMUSCULAR

## 2024-09-05 MED ORDER — ALBUTEROL SULFATE (2.5 MG/3ML) 0.083% IN NEBU
INHALATION_SOLUTION | RESPIRATORY_TRACT | Status: AC
  Filled 2024-09-05: qty 3

## 2024-09-05 MED ORDER — PROMETHAZINE-DM 6.25-15 MG/5ML PO SYRP: 5.0000 mL | ORAL_SOLUTION | Freq: Every evening | ORAL | 0 refills | Status: DC | PRN

## 2024-09-05 MED ORDER — PREDNISONE 20 MG PO TABS: 40.0000 mg | ORAL_TABLET | Freq: Every day | ORAL | 0 refills | Status: AC

## 2024-09-05 NOTE — ED Provider Notes (Signed)
 [facility]    CSN: [id number] Arrival date & time: 09/05/24  9157      History   Chief Complaint Chief Complaint  Patient presents with   Cough   Sore Throat   Headache    HPI [name] is a 28 y.o. female.   [name] is a 28 y.o. female presenting for chief complaint of Cough, Sore Throat, and Headache that started 2 days ago on Sunday, September 03, 2024.  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  She started coughing today and states she feels as though she is wheezing.  Reports intermittent shortness of breath with coughing.  Cough is productive with yellow phlegm.  Never smoker, denies history of asthma/COPD.  Denies nausea, vomiting, diarrhea, abdominal pain, rash, and fever/chills.  No recent antibiotic or steroid use in the last 90 days.  History of bronchitis, states this feels similar.  She had albuterol  inhaler leftover from previous prescription/illness and uses last night with a little bit of relief of shortness of breath.   Cough Associated symptoms: headaches   Sore Throat Associated symptoms include headaches.  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      Home Medications    Prior to Admission medications   Medication Sig Start Date End Date Taking? Authorizing Provider  albuterol  (VENTOLIN  HFA) 108 (90 Base) MCG/ACT inhaler Inhale 2 puffs into the lungs every 6 (six) hours as needed for wheezing or shortness of breath. 09/05/24  Yes Enedelia Dorna HERO, FNP  predniSONE  (DELTASONE ) 20 MG tablet Take 2 tablets (40 mg total) by mouth daily with breakfast for 5 days. 09/05/24 09/10/24 Yes StanhopeDorna HERO, FNP  promethazine -dextromethorphan (PROMETHAZINE -DM) 6.25-15 MG/5ML syrup Take 5 mLs by mouth at bedtime as needed for cough. 09/05/24  Yes Enedelia Dorna HERO, FNP  venlafaxine  XR (EFFEXOR -XR) 75 MG 24 hr capsule Take 1 capsule (75 mg total) by mouth daily. 07/14/24  Yes Nwoko, Uchenna E, PA  desonide  (DESONATE ) 0.05 % gel Apply topically 2 (two) times daily. 08/19/23   Kayla Jeoffrey RAMAN, FNP    Family History History reviewed. No pertinent family history.  Social History Social History   Tobacco Use   Smoking status: Never   Smokeless tobacco: Never  Vaping Use   Vaping status: Never Used  Substance Use Topics   Alcohol use: No   Drug use: No     Allergies   Patient has no known allergies.   Review of Systems Review of Systems  Respiratory:  Positive for cough.   Neurological:  Positive for headaches.  Per HPI   Physical  Exam Triage Vital Signs ED Triage Vitals  Encounter Vitals Group     BP 09/05/24 0952 (!) 143/97     Girls Systolic BP Percentile --      Girls Diastolic BP Percentile --      Boys Systolic BP Percentile --      Boys Diastolic BP Percentile --      Pulse Rate 09/05/24 0952 90     Resp 09/05/24 0952 20     Temp 09/05/24 0952 98.9 F (37.2 C)     Temp src --      SpO2 09/05/24 0952 97 %     Weight --      Height --      Head Circumference --      Peak Flow --      Pain Score  09/05/24 0950 5     Pain Loc --      Pain Education --      Exclude from Growth Chart --    No data found.  Updated Vital Signs BP (!) 143/97   Pulse 90   Temp 98.9 F (37.2 C)   Resp 20   LMP 09/03/2024 (Exact Date)   SpO2 97%   Visual Acuity Right Eye Distance:   Left Eye Distance:   Bilateral Distance:    Right Eye Near:   Left Eye Near:    Bilateral Near:     Physical Exam Vitals and nursing note reviewed.  Constitutional:      Appearance: She is not ill-appearing or toxic-appearing.  HENT:     Head: Normocephalic and atraumatic.     Right Ear: Hearing, tympanic membrane, ear canal and external ear normal.     Left Ear: Hearing, tympanic membrane, ear canal and external ear normal.     Nose: Congestion present.     Mouth/Throat:     Lips: Pink.     Mouth: Mucous membranes are moist. No injury or oral lesions.     Dentition: Normal dentition.     Tongue: No lesions.     Pharynx: Oropharynx is clear. Uvula midline. Posterior oropharyngeal erythema present. No pharyngeal swelling, oropharyngeal exudate, uvula swelling or postnasal drip.     Tonsils: No tonsillar exudate.  Eyes:     General: Lids are normal. Vision grossly intact. Gaze aligned appropriately.     Extraocular Movements: Extraocular movements intact.     Conjunctiva/sclera: Conjunctivae normal.  Neck:     Trachea: Trachea and phonation normal.  Cardiovascular:     Rate and Rhythm: Normal rate and regular rhythm.     Heart sounds: Normal heart sounds, S1 normal and S2 normal.  Pulmonary:     Effort: Pulmonary effort is normal. No respiratory distress.     Breath sounds: Normal air entry. Wheezing (Expiratory wheezing heard with coarse and harsh bronchial sounding cough with deep inspiration) present. No rhonchi or rales.     Comments: Speaking in full sentences with no increased respiratory effort. Chest:     Chest wall: No tenderness.  Musculoskeletal:     Cervical back: Neck supple.   Lymphadenopathy:     Cervical: No cervical adenopathy.  Skin:    General: Skin is warm and dry.     Capillary Refill: Capillary refill takes less than 2 seconds.     Findings: No rash.  Neurological:     General: No focal deficit present.     Mental Status: She is alert and oriented to person, place, and time. Mental status  is at baseline.     Cranial Nerves: No dysarthria or facial asymmetry.  Psychiatric:        Mood and Affect: Mood normal.        Speech: Speech normal.        Behavior: Behavior normal.        Thought Content: Thought content normal.        Judgment: Judgment normal.      UC Treatments / Results  Labs (all labs ordered are listed, but only abnormal results are displayed) Labs Reviewed  POC SARS CORONAVIRUS 2 AG -  ED    EKG   Radiology No results found.  Procedures Procedures (including critical care time)  Medications Ordered in UC Medications  methylPREDNISolone  sodium succinate (SOLU-MEDROL ) 125 mg/2 mL injection 80 mg (80 mg Intramuscular Given 09/05/24 1054)  albuterol  (PROVENTIL ) (2.5 MG/3ML) 0.083% nebulizer solution 2.5 mg (2.5 mg Nebulization Given 09/05/24 1057)    Initial Impression / Assessment and Plan / UC Course  I have reviewed the triage vital signs and the nursing notes.  Pertinent labs & imaging results that were available during my care of the patient were reviewed by me and considered in my medical decision making (see chart for details).   1.  Acute bronchitis, shortness of breath, acute cough Presentation consistent with acute viral bronchitis.  Albuterol  nebulizer treatment given with significant symptomatic improvement reported by patient and lung sounds on reassessment.   Viral testing: Point-of-care COVID-19 testing is negative.  Deferred imaging based on stable cardiopulmonary exam and hemodynamically stable vital signs.  Treatment plan:  Solu-Medrol  80 mg IM IM steroid given today, prednisone  burst to be started  tomorrow.   Will treat with prednisone  burst, PRN use of albuterol , mucinex, and antitussive at bedtime.    Work note given.   Counseled patient on potential for adverse effects with medications prescribed/recommended today, strict ER and return-to-clinic precautions discussed, patient verbalized understanding.    Final Clinical Impressions(s) / UC Diagnoses   Final diagnoses:  Acute cough  Shortness of breath  Acute bronchitis, unspecified organism     Discharge Instructions      You have bronchitis which is inflammation of the upper airways in your lungs due to a virus.   We gave you an injection of steroid in the clinic today to jumpstart treatment of inflammation to the lungs.   Start taking prednisone  pills as prescribed tomorrow with food once daily as prescribed (2 pills - 40mg  - once daily preferably in the morning).   Do not take any NSAIDs with steroid pills (no ibuprofen, naproxen  while taking steroid, this could cause stomach upset).   Use albuterol  inhaler 2 puffs every 4-6 hours on a schedule for the next 24 hours while the steroid kicks in, then as needed for cough, shortness of breath, and wheezing.   Use guaifenesin (plain mucinex) to break up congestion in nose/chest so that you are able to excrete easier. Drink plenty of fluids to stay well hydrated while taking mucinex so that it works well in the body.   Promethazine  DM cough syrup may be used at bedtime for coughing, this medicine will cause you to be sleepy so only take at bedtime.   If you develop any new or worsening symptoms or if your symptoms do not start to improve, please return here or follow-up with your primary care provider. If your symptoms are severe, please go to the emergency room.    ED Prescriptions  Medication Sig Dispense Auth. Provider   promethazine -dextromethorphan (PROMETHAZINE -DM) 6.25-15 MG/5ML syrup Take 5 mLs by mouth at bedtime as needed for cough. 118 mL Enedelia Going M, FNP   predniSONE  (DELTASONE ) 20 MG tablet Take 2 tablets (40 mg total) by mouth daily with breakfast for 5 days. 10 tablet Enedelia Going HERO, FNP   albuterol  (VENTOLIN  HFA) 108 (90 Base) MCG/ACT inhaler Inhale 2 puffs into the lungs every 6 (six) hours as needed for wheezing or shortness of breath. 18 g Enedelia Going HERO, FNP      PDMP not reviewed this encounter.   Enedelia Going HERO, [place] 09/05/24 1122

## 2024-09-05 NOTE — ED Triage Notes (Signed)
 PT went to [facility] on Sunday with sore throat all the test were Neg. Pt developed a cough yesterday with SHOB . Pt has had a HA since Sunday.

## 2024-09-05 NOTE — Discharge Instructions (Signed)
 You have bronchitis which is inflammation of the upper airways in your lungs due to a virus.   We gave you an injection of steroid in the clinic today to jumpstart treatment of inflammation to the lungs.   Start taking prednisone  pills as prescribed tomorrow with food once daily as prescribed (2 pills - 40mg  - once daily preferably in the morning).   Do not take any NSAIDs with steroid pills (no ibuprofen, naproxen while taking steroid, this could cause stomach upset).   Use albuterol  inhaler 2 puffs every 4-6 hours on a schedule for the next 24 hours while the steroid kicks in, then as needed for cough, shortness of breath, and wheezing.   Use guaifenesin (plain mucinex) to break up congestion in nose/chest so that you are able to excrete easier. Drink plenty of fluids to stay well hydrated while taking mucinex so that it works well in the body.   Promethazine  DM cough syrup may be used at bedtime for coughing, this medicine will cause you to be sleepy so only take at bedtime.   If you develop any new or worsening symptoms or if your symptoms do not start to improve, please return here or follow-up with your primary care provider. If your symptoms are severe, please go to the emergency room.

## 2024-09-12 ENCOUNTER — Encounter (HOSPITAL_COMMUNITY): Payer: Self-pay | Admitting: *Deleted

## 2024-09-12 ENCOUNTER — Ambulatory Visit (HOSPITAL_COMMUNITY)
Admission: EM | Admit: 2024-09-12 | Discharge: 2024-09-12 | Disposition: A | Discharge status: Home / Self Care | Payer: MEDICAID | Attending: Emergency Medicine | Admitting: Emergency Medicine

## 2024-09-12 ENCOUNTER — Ambulatory Visit (INDEPENDENT_AMBULATORY_CARE_PROVIDER_SITE_OTHER): Payer: MEDICAID

## 2024-09-12 DIAGNOSIS — J22 Unspecified acute lower respiratory infection: Secondary | ICD-10-CM | POA: Diagnosis not present

## 2024-09-12 LAB — POC SARS CORONAVIRUS 2 AG -  ED: SARS Coronavirus 2 Ag: NEGATIVE

## 2024-09-12 MED ORDER — GUAIFENESIN 400 MG PO TABS: ORAL_TABLET | ORAL | 0 refills | Status: AC

## 2024-09-12 MED ORDER — AZITHROMYCIN 250 MG PO TABS: ORAL_TABLET | ORAL | 0 refills | Status: AC

## 2024-09-12 MED ORDER — AMOXICILLIN-POT CLAVULANATE 875-125 MG PO TABS: 1.0000 | ORAL_TABLET | Freq: Two times a day (BID) | ORAL | 0 refills | Status: AC

## 2024-09-12 MED ORDER — ALBUTEROL SULFATE HFA 108 (90 BASE) MCG/ACT IN AERS: 2.0000 | INHALATION_SPRAY | Freq: Four times a day (QID) | RESPIRATORY_TRACT | 2 refills | Status: AC | PRN

## 2024-09-12 NOTE — ED Triage Notes (Signed)
 Pt states she has been coughing and congestion worse since last visit 09/05/2024. Pt states she finished the prednisone  and is still using the albuterol  last dose was 2am. She woke up struggling this morning. She also is taking sudafed. Chest pain from so much coughing

## 2024-09-12 NOTE — ED Notes (Signed)
 Reviewed work note

## 2024-09-12 NOTE — ED Provider Notes (Signed)
 [facility]    CSN: [id number] Arrival date & time: 09/12/24  9188    HISTORY   Chief Complaint  Patient presents with   Cough   Nasal Congestion   HPI [name] is a pleasant, 28 y.o. female who presents to urgent care today. Patient was seen 7 days ago at urgent care where she was diagnosed with bronchitis, provided with albuterol , prednisone  and Promethazine  DM for nighttime cough.  Patient states that they only took the Promethazine  DM 1 time because upon awakening the next day, they were incredibly short of breath and had a lot of sputum to cough up.  Patient states they did feel temporarily better while taking prednisone  and states the albuterol  helps a little bit but feels like they are getting worse again.  Patient continues to have good O2 sats however blood pressure and heart rate are elevated on arrival today.  Patient remains afebrile.  Patient states they are coughing up a lot of sputum and has also begun having nasal congestion and rhinorrhea which is new.  COVID-19 test performed 7 days ago was negative.  Will repeat today.   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    Home Medications    Prior to Admission medications   Medication Sig Start Date End Date Taking? Authorizing Provider  albuterol  (VENTOLIN  HFA) 108 (90 Base) MCG/ACT inhaler Inhale 2 puffs into the lungs every 6 (six) hours as needed for wheezing or shortness of breath (Cough). 09/12/24  Yes Joesph Shaver Scales, PA-C  amoxicillin -clavulanate (AUGMENTIN ) 875-125 MG tablet Take 1 tablet by mouth 2 (two) times daily for 5 days. 09/12/24 09/17/24 Yes Joesph Shaver Scales, PA-C  azithromycin  (ZITHROMAX ) 250 MG tablet Take 2 tablets (500 mg total) by mouth daily for 1 day, THEN 1 tablet (250 mg total) daily for 4 days. 09/12/24 09/17/24 Yes Joesph Shaver Scales, PA-C  guaifenesin  (HUMIBID E) 400 MG TABS tablet Take 1 tablet 3 times daily as needed for chest congestion and cough 09/12/24  Yes Joesph Shaver Scales, PA-C  venlafaxine  XR (EFFEXOR -XR) 75 MG 24 hr capsule Take 1 capsule (75 mg total) by mouth daily. 07/14/24  Yes Nwoko, Uchenna E, PA  desonide  (DESONATE ) 0.05 % gel Apply topically 2 (two) times daily. 08/19/23   Kayla Jeoffrey RAMAN, FNP    Family History History reviewed. No pertinent family history. Social History Social History   Tobacco Use   Smoking status: Never   Smokeless tobacco: Never  Vaping Use   Vaping status: Never Used  Substance Use Topics   Alcohol use: No   Drug use: No   Allergies   Patient has no known allergies.  Review of Systems Review of  Systems  Respiratory:  Positive for cough.    Pertinent findings revealed after performing a 14 point review of systems has been noted in the history of present illness.  Physical Exam Vital Signs BP (!) 156/84 (BP Location: Left Arm)   Pulse (!) 103   Temp 98.3 F (36.8 C) (Oral)   Resp 20   LMP 09/03/2024 (Exact Date)   SpO2 98%   No data found.  Physical Exam  Visual Acuity Right Eye Distance:   Left Eye Distance:   Bilateral Distance:    Right Eye Near:   Left Eye Near:    Bilateral  Near:     [facility] Couse / Diagnostics / Procedures:     Radiology DG Chest 2 View Result Date: 09/12/2024 EXAM: 2 VIEW(S) XRAY OF THE CHEST 09/12/2024 09:20:01 AM COMPARISON: None available. CLINICAL HISTORY: [facility], persistent cough, SOB. Pt states she has been coughing and congestion worse since last visit 09/05/2024. Pt states she finished the prednisone  and is still using the albuterol  last dose was 2am. She woke up struggling this morning. She also is taking sudafed. Chest pain from so much coughing. FINDINGS: LUNGS AND PLEURA: No focal pulmonary opacity. No pulmonary edema. No pleural effusion. No pneumothorax. HEART AND MEDIASTINUM: No acute abnormality of the cardiac and mediastinal silhouettes. BONES AND SOFT TISSUES: No acute osseous abnormality. IMPRESSION: 1. No acute process. Electronically signed by: Donnice Mania MD 09/12/2024 09:37 AM EDT RP Workstation: [id number]    Procedures Procedures (including critical care time) EKG  Pending results:  Labs Reviewed  POC SARS CORONAVIRUS 2 AG -  ED    Medications Ordered in UC: Medications - No data to display  [facility] Diagnoses / Final Clinical Impressions(s)   I have reviewed the triage vital signs and the nursing notes.  Pertinent labs & imaging results that were available during my care of the patient were reviewed by me and considered in my medical decision making (see chart for details).    Final diagnoses:  Lower respiratory tract infection   COVID-19 test today was negative.  Chest x-ray is concerning for pneumonia per personal read in addition to my physical exam findings and patient history.  Patient provided with Augmentin  and azithromycin  for empiric treatment.  Patient encouraged to continue using albuterol  liberally and to begin guaifenesin  to help promote expectoration.  Okay to use Promethazine  DM at nighttime as needed.  Conservative care recommended.  Return precautions advised.    Please see discharge instructions below  for details of plan of care as provided to patient. ED Prescriptions     Medication Sig Dispense Auth. Provider   amoxicillin -clavulanate (AUGMENTIN ) 875-125 MG tablet Take 1 tablet by mouth 2 (two) times daily for 5 days. 10 tablet Joesph Shaver Scales, PA-C   azithromycin  (ZITHROMAX ) 250 MG tablet Take 2 tablets (500 mg total) by mouth daily for 1 day, THEN 1 tablet (250 mg total) daily for 4 days. 6 tablet Joesph Shaver Scales, PA-C   albuterol  (VENTOLIN  HFA) 108 (90 Base) MCG/ACT inhaler Inhale 2 puffs into the lungs every 6 (six) hours as needed for wheezing or shortness of breath (Cough). 18 g Joesph Shaver Scales, PA-C   guaifenesin  (HUMIBID E) 400 MG TABS tablet Take 1 tablet 3 times daily as needed for chest congestion and cough 30 tablet Joesph Shaver Scales, PA-C      PDMP not reviewed this encounter.  Pending results:  Labs Reviewed  POC SARS CORONAVIRUS 2 AG -  ED  Discharge Instructions      Please read below to learn more about the medications, dosages and frequencies that I recommend to help alleviate your symptoms and to get you feeling better soon:   Augmentin  (amoxicillin  - clavulanic acid):  Please take one (1) dose twice daily for 5 days.  This antibiotic can cause upset stomach, this will resolve once antibiotics are complete.  You are welcome to take a probiotic, eat yogurt, take Imodium while taking this medication.  Please avoid other systemic medications such as Maalox, Pepto-Bismol or milk of magnesia as they can interfere with the body's ability to absorb the antibiotics.    Z-Pak (azithromycin ):  Please take two (2) tablets on day one and one tablet daily thereafter until the prescription is complete.This antibiotic can cause upset stomach, this will resolve once antibiotics are complete.  You are welcome to take a probiotic, eat yogurt, take Imodium while taking this medication.  Please avoid other systemic medications such as Maalox, Pepto-Bismol  or milk of magnesia as they can interfere with the body's ability to absorb the antibiotics.       ProAir , Ventolin , Proventil  (albuterol ): This inhaled medication contains a short acting beta agonist bronchodilator.  This medication works on the smooth muscle that opens and constricts of your airways by relaxing the muscle.  The result of relaxation of the smooth muscle is increased air movement and improved work of breathing.  This is a short acting medication that can be used every 4-6 hours as needed for increased work of breathing, shortness of breath, wheezing and excessive coughing.     Robitussin, Mucinex  (guaifenesin ): This is a daytime expectorant.  This single symptom reliever helps break up chest congestion and loosen up thick nasal drainage making phlegm and drainage easier to cough up and to blow out from your nose.  I recommend taking 400 mg in either liquid or tablet form three times daily as needed.  I do not recommend the 12-hour extended relief version or doses higher than 400 mg per each dose as these often make some patients feel jittery or jumpy and can interfere with sleep.  I also do not recommend that you purchase guaifenesin  with the ingredient  DM which is dextromethorphan, a cough suppressant which I only recommend taking at bedtime.  Guaifenesin  400 mg is a safe dose for people who are being treated for high blood pressure.     Promethazine  DM: Promethazine  is both a nasal decongestant that dries up mucous membranes and an antinausea medication.  Promethazine  often makes most patients feel fairly sleepy.  DM is dextromethorphan, a single symptom reliever which is a cough suppressant found in many over-the-counter cough medications and combination cold preparations.  Please take 5 mL before bedtime to minimize your cough which will help you sleep better.    If symptoms have not meaningfully improved in the next 7 to 10 days, please return for repeat evaluation or follow-up  with your regular provider.  If symptoms have worsened in the next 3 to 5 days, please go to the emergency room for further evaluation.    Thank you for visiting urgent care today.  We appreciate the opportunity to participate in your care.     Disposition Upon Discharge:  Condition: stable for discharge home  Patient presented with an acute illness with associated systemic symptoms and significant discomfort requiring urgent management. In my opinion, this is a condition that a prudent lay person (someone who possesses an average knowledge of  health and medicine) may potentially expect to result in complications if not addressed urgently such as respiratory distress, impairment of bodily function or dysfunction of bodily organs.   Routine symptom specific, illness specific and/or disease specific instructions were discussed with the patient and/or caregiver at length.   As such, the patient has been evaluated and assessed, work-up was performed and treatment was provided in alignment with urgent care protocols and evidence based medicine.  Patient/parent/caregiver has been advised that the patient may require follow up for further testing and treatment if the symptoms continue in spite of treatment, as clinically indicated and appropriate.  Patient/parent/caregiver has been advised to return to the [facility] or PCP if no better; to PCP or the Emergency Department if new signs and symptoms develop, or if the current signs or symptoms continue to change or worsen for further workup, evaluation and treatment as clinically indicated and appropriate  The patient will follow up with their current PCP if and as advised. If the patient does not currently have a PCP we will assist them in obtaining one.   The patient may need specialty follow up if the symptoms continue, in spite of conservative treatment and management, for further workup, evaluation, consultation and treatment as clinically indicated and  appropriate.  Patient/parent/caregiver verbalized understanding and agreement of plan as discussed.  All questions were addressed during visit.  Please see discharge instructions below for further details of plan.  This office note has been dictated using Teaching laboratory technician.  Unfortunately, this method of dictation can sometimes lead to typographical or grammatical errors.  I apologize for your inconvenience in advance if this occurs.  Please do not hesitate to reach out to me if clarification is needed.      Joesph Shaver Scales, [place] 09/12/24 [phone]

## 2024-09-12 NOTE — Discharge Instructions (Addendum)
 Please read below to learn more about the medications, dosages and frequencies that I recommend to help alleviate your symptoms and to get you feeling better soon:   Augmentin  (amoxicillin  - clavulanic acid):  Please take one (1) dose twice daily for 5 days.  This antibiotic can cause upset stomach, this will resolve once antibiotics are complete.  You are welcome to take a probiotic, eat yogurt, take Imodium while taking this medication.  Please avoid other systemic medications such as Maalox, Pepto-Bismol or milk of magnesia as they can interfere with the body's ability to absorb the antibiotics.    Z-Pak (azithromycin ):  Please take two (2) tablets on day one and one tablet daily thereafter until the prescription is complete.This antibiotic can cause upset stomach, this will resolve once antibiotics are complete.  You are welcome to take a probiotic, eat yogurt, take Imodium while taking this medication.  Please avoid other systemic medications such as Maalox, Pepto-Bismol or milk of magnesia as they can interfere with the body's ability to absorb the antibiotics.       ProAir , Ventolin , Proventil  (albuterol ): This inhaled medication contains a short acting beta agonist bronchodilator.  This medication works on the smooth muscle that opens and constricts of your airways by relaxing the muscle.  The result of relaxation of the smooth muscle is increased air movement and improved work of breathing.  This is a short acting medication that can be used every 4-6 hours as needed for increased work of breathing, shortness of breath, wheezing and excessive coughing.     Robitussin, Mucinex  (guaifenesin ): This is a daytime expectorant.  This single symptom reliever helps break up chest congestion and loosen up thick nasal drainage making phlegm and drainage easier to cough up and to blow out from your nose.  I recommend taking 400 mg in either liquid or tablet form three times daily as needed.  I do not recommend  the 12-hour extended relief version or doses higher than 400 mg per each dose as these often make some patients feel jittery or jumpy and can interfere with sleep.  I also do not recommend that you purchase guaifenesin  with the ingredient  DM which is dextromethorphan, a cough suppressant which I only recommend taking at bedtime.  Guaifenesin  400 mg is a safe dose for people who are being treated for high blood pressure.     Promethazine  DM: Promethazine  is both a nasal decongestant that dries up mucous membranes and an antinausea medication.  Promethazine  often makes most patients feel fairly sleepy.  DM is dextromethorphan, a single symptom reliever which is a cough suppressant found in many over-the-counter cough medications and combination cold preparations.  Please take 5 mL before bedtime to minimize your cough which will help you sleep better.    If symptoms have not meaningfully improved in the next 7 to 10 days, please return for repeat evaluation or follow-up with your regular provider.  If symptoms have worsened in the next 3 to 5 days, please go to the emergency room for further evaluation.    Thank you for visiting urgent care today.  We appreciate the opportunity to participate in your care.

## 2024-09-15 ENCOUNTER — Encounter (HOSPITAL_COMMUNITY): Payer: Self-pay

## 2024-09-15 ENCOUNTER — Telehealth (HOSPITAL_COMMUNITY): Payer: MEDICAID | Admitting: Physician Assistant

## 2024-09-15 DIAGNOSIS — F330 Major depressive disorder, recurrent, mild: Secondary | ICD-10-CM | POA: Diagnosis not present

## 2024-09-16 ENCOUNTER — Encounter (HOSPITAL_COMMUNITY): Payer: Self-pay | Admitting: Physician Assistant

## 2024-09-16 MED ORDER — VENLAFAXINE HCL ER 75 MG PO CP24
75.0000 mg | ORAL_CAPSULE | Freq: Every day | ORAL | 2 refills | Status: AC
Start: 2024-09-16 — End: ?

## 2024-09-16 NOTE — Progress Notes (Signed)
 BH MD/PA/NP OP Progress Note  Virtual Visit via Video Note  I connected with [name] on 09/15/24 at 10:00 AM EDT by a video enabled telemedicine application and verified that I am speaking with the correct person using two identifiers.  Location: Patient: Home Provider: Clinic   I discussed the limitations of evaluation and management by telemedicine and the availability of in person appointments. The patient expressed understanding and agreed to proceed.  Follow Up Instructions:  I discussed the assessment and treatment plan with the patient. The patient was provided an opportunity to ask questions and all were answered. The patient agreed with the plan and demonstrated an understanding of the instructions.   The patient was advised to call back or seek an in-person evaluation if the symptoms worsen or if the condition fails to improve as anticipated.  I provided 12 minutes of non-face-to-face time during this encounter.  [name], PA    09/15/2024 10:00 AM [name]  MRN:  [id number]  Chief Complaint:  Chief Complaint  Patient presents with   Follow-up   Medication Refill   HPI:   [name]. Papin is a 28 year old, Caucasian female with a past psychiatric history significant for major depressive disorder and anxiety who presents to [facility] via virtual video visit for follow-up and medication management.  Patient is currently being managed on the following psychiatric medications: Venlafaxine  XR 75 mg daily.  Patient reports that she has been doing generally well but was recently diagnosed with pneumonia this past Tuesday.  She reports that she was put on a short course of prednisone  which she believes caused her to be more irritable.  She reports that she started venlafaxine  XR 75 mg daily and has been taking it for roughly 2 weeks.  Since taking the medication, patient reports that her mood has been okay.  Patient  endorses minimal depression and rates her depression a 2 out of 10 with 10 being most severe.  Patient endorses depressive episodes 1 day/week.  Patient attributes her depressive symptoms to the recent anniversary of her mother's passing.  Patient endorses the following depressive symptoms: loneliness and hopelessness.  Patient denies anxiety and further denies any new stressors at this time.  A PHQ-9 screening was performed with the patient scoring a 1.  A GAD-7 screen was also performed with the patient scoring a 3.  Patient is alert and oriented x 4, calm, cooperative, and fully engaged in conversation during the encounter.  Patient endorses good mood.  Patient exhibits euthymic mood with appropriate affect.  Patient denies suicidal or homicidal ideations.  She further denies auditory or visual hallucinations and does not appear to be responding to internal/external stimuli.  Patient endorses fair sleep and receives on average 10 hours of sleep per night.  Patient reports that she has been sleeping more since being sick.  Patient endorses good appetite and eats on average 3 meals per day.  Patient denies alcohol consumption, tobacco use, or illicit drug use.  Visit Diagnosis:    ICD-10-CM   1. Mild episode of recurrent major depressive disorder (HCC)  F33.0 venlafaxine  XR (EFFEXOR -XR) 75 MG 24 hr capsule       Past Psychiatric History:  Major depressive disorder Generalized anxiety disorder  Past Medical History:  Past Medical History:  Diagnosis Date   Anxiety    Depression    Medical history non-contributory     Past Surgical History:  Procedure Laterality Date  TYMPANOSTOMY TUBE PLACEMENT      Family Psychiatric History:  None  Family History: History reviewed. No pertinent family history.  Social History:  Social History   Socioeconomic History   Marital status: Significant Other    Spouse name: Not on file   Number of children: Not on file   Years of education: Not on  file   Highest education level: Not on file  Occupational History   Not on file  Tobacco Use   Smoking status: Never   Smokeless tobacco: Never  Vaping Use   Vaping status: Never Used  Substance and Sexual Activity   Alcohol use: No   Drug use: No   Sexual activity: Never  Other Topics Concern   Not on file  Social History Narrative   Not on file   Social Drivers of Health   Financial Resource Strain: Not on file  Food Insecurity: Not on file  Transportation Needs: Not on file  Physical Activity: Not on file  Stress: Not on file  Social Connections: Not on file    Allergies: No Known Allergies  Metabolic Disorder Labs: Lab Results  Component Value Date   HGBA1C 4.7 (L) 03/06/2021   MPG 88.19 03/06/2021   MPG 105 03/25/2015   No results found for: PROLACTIN Lab Results  Component Value Date   CHOL 180 08/09/2023   TRIG 48 08/09/2023   HDL 55 08/09/2023   CHOLHDL 3.3 08/09/2023   LDLCALC 111 (H) 08/09/2023   Lab Results  Component Value Date   TSH 1.48 08/09/2023   TSH 1.864 04/08/2020    Therapeutic Level Labs: No results found for: LITHIUM No results found for: VALPROATE No results found for: CBMZ  Current Medications: Current Outpatient Medications  Medication Sig Dispense Refill   albuterol  (VENTOLIN  HFA) 108 (90 Base) MCG/ACT inhaler Inhale 2 puffs into the lungs every 6 (six) hours as needed for wheezing or shortness of breath (Cough). 18 g 2   amoxicillin -clavulanate (AUGMENTIN ) 875-125 MG tablet Take 1 tablet by mouth 2 (two) times daily for 5 days. 10 tablet 0   azithromycin  (ZITHROMAX ) 250 MG tablet Take 2 tablets (500 mg total) by mouth daily for 1 day, THEN 1 tablet (250 mg total) daily for 4 days. 6 tablet 0   desonide  (DESONATE ) 0.05 % gel Apply topically 2 (two) times daily. 60 g 0   guaifenesin  (HUMIBID E) 400 MG TABS tablet Take 1 tablet 3 times daily as needed for chest congestion and cough 30 tablet 0   venlafaxine  XR  (EFFEXOR -XR) 75 MG 24 hr capsule Take 1 capsule (75 mg total) by mouth daily. 30 capsule 2   No current facility-administered medications for this visit.     Musculoskeletal: Strength & Muscle Tone: within normal limits Gait & Station: normal Patient leans: N/A  Psychiatric Specialty Exam: Review of Systems  Psychiatric/Behavioral:  Negative for decreased concentration, dysphoric mood, hallucinations, self-injury, sleep disturbance and suicidal ideas. The patient is not nervous/anxious and is not hyperactive.     Last menstrual period 09/03/2024.There is no height or weight on file to calculate BMI.  General Appearance: Casual  Eye Contact:  Good  Speech:  Clear and Coherent and Normal Rate  Volume:  Normal  Mood:  Euthymic  Affect:  Appropriate  Thought Process:  Coherent, Goal Directed, and Descriptions of Associations: Intact  Orientation:  Full (Time, Place, and Person)  Thought Content: WDL   Suicidal Thoughts:  No  Homicidal Thoughts:  No  Memory:  Immediate;   Good Recent;   Good Remote;   Good  Judgement:  Good  Insight:  Good  Psychomotor Activity:  Normal  Concentration:  Concentration: Good and Attention Span: Good  Recall:  Good  Fund of Knowledge: Good  Language: Good  Akathisia:  No  Handed:  Right  AIMS (if indicated): not done  Assets:  Communication Skills Desire for Improvement Housing Social Support Vocational/Educational  ADL's:  Intact  Cognition: WNL  Sleep:  Good   Screenings: AIMS    Flowsheet Row [facility]  AIMS Total Score 0 0   AUDIT    Flowsheet Row [facility]  Alcohol Use Disorder Identification Test Final  Score (AUDIT) 0 0   GAD-7    Flowsheet Row Video Visit from 09/15/2024 in [facility] Video Visit from 07/14/2024 in [facility] Video Visit from 01/14/2024 in [facility] Video Visit from 10/15/2023 in [facility] Office Visit from 08/19/2023 in [facility]  Total GAD-7 Score 3 5 0 2 3   PHQ2-9    Flowsheet Row Video Visit from 09/15/2024 in [facility] Video Visit from 07/14/2024 in [facility] Video Visit from 01/14/2024 in [facility] Video Visit from 10[facility]15/2023 in [facility] Office Visit from 08/19/2023 in [facility]  PHQ-2 Total Score 1 0 0 0 2  PHQ-9 Total Score -- -- -- -- 6   Flowsheet Row Video Visit from 09/15/2024 in [facility] UC from 09/12/2024 in [facility] UC from 09/05/2024 in [facility] RISK CATEGORY Moderate Risk No Risk No Risk     Assessment and Plan:   Ellisa B. Lewey is a 28 year old, Caucasian female with a past psychiatric history significant for major depressive disorder and anxiety who presents to [facility] via virtual video visit for follow-up and medication management.  Patient presents to the encounter stating that she has been taking her venlafaxine  XR 75 mg daily for the past 2 weeks and denies experiencing any adverse side effects.  She endorses some depression she attributes to the anniversary of her mother's passing/birthday.  Despite experiencing some depression, patient reports that her symptoms have been mostly manageable through the use of her venlafaxine  XR.  Patient further denies anxiety.  A PHQ-9 screen was performed with the patient scoring a  1.  A GAD-7 screen was also performed with the patient scoring a 3.  Patient endorses stability through the use of her venlafaxine  XR and would like to continue taking the medication as prescribed.  Patient's medication to be e-prescribed to pharmacy of choice.  A [place] Suicide Severity Rating Scale was performed with the patient being considered moderate risk.  Patient denies suicidal ideations and is able to contract for safety at this time.  Safety planning was discussed with the patient prior to the conclusion of the encounter.  - Patient was instructed to contact 911 in the event of a mental health crisis. - Patient was instructed to contact [facility] [facility] [employer] in the  event of a mental health crisis. - Patient was instructed to present to [facility] in the event of a mental health crisis.  Collaboration of Care: Collaboration of Care: Medication Management AEB provider managing patient's psychiatric medications and Psychiatrist AEB patient being seen by clinical social worker at the school that  Patient/Guardian was advised Release of Information must be obtained prior to any record release in order to collaborate their care with an outside provider. Patient/Guardian was advised if they have not already done so to contact the registration department to sign all necessary forms in order for us  to release information regarding their care.   Consent: Patient/Guardian gives verbal consent for treatment and assignment of benefits for services provided during this visit. Patient/Guardian expressed understanding and agreed to proceed.   1. Mild episode of recurrent major depressive disorder (HCC)  - venlafaxine  XR (EFFEXOR -XR) 75 MG 24 hr capsule; Take 1 capsule (75 mg total) by mouth daily.  Dispense: 30 capsule; Refill: 2  Patient to follow-up in 2 months Provider spent a total of 12 minutes with the patient/reviewing patient's chart  [name], PA 09/15/2024, 10:00 AM

## 2024-10-18 ENCOUNTER — Ambulatory Visit (INDEPENDENT_AMBULATORY_CARE_PROVIDER_SITE_OTHER): Payer: MEDICAID | Admitting: Family Medicine

## 2024-10-18 ENCOUNTER — Encounter: Payer: Self-pay | Admitting: Family Medicine

## 2024-10-18 VITALS — BP 120/85 | HR 90 | Temp 97.4°F | Ht 63.0 in | Wt 184.0 lb

## 2024-10-18 DIAGNOSIS — F411 Generalized anxiety disorder: Secondary | ICD-10-CM | POA: Diagnosis not present

## 2024-10-18 DIAGNOSIS — L409 Psoriasis, unspecified: Secondary | ICD-10-CM | POA: Diagnosis not present

## 2024-10-18 DIAGNOSIS — M25542 Pain in joints of left hand: Secondary | ICD-10-CM

## 2024-10-18 DIAGNOSIS — Z0000 Encounter for general adult medical examination without abnormal findings: Secondary | ICD-10-CM | POA: Diagnosis not present

## 2024-10-18 DIAGNOSIS — Z0001 Encounter for general adult medical examination with abnormal findings: Secondary | ICD-10-CM

## 2024-10-18 DIAGNOSIS — F339 Major depressive disorder, recurrent, unspecified: Secondary | ICD-10-CM

## 2024-10-18 LAB — COMPREHENSIVE METABOLIC PANEL WITH GFR
AG Ratio: 1.7 (calc) (ref 1.0–2.5)
ALT: 21 U/L (ref 6–29)
AST: 18 U/L (ref 10–30)
Albumin: 4.3 g/dL (ref 3.6–5.1)
Alkaline phosphatase (APISO): 45 U/L (ref 31–125)
BUN: 8 mg/dL (ref 7–25)
CO2: 30 mmol/L (ref 20–32)
Calcium: 9.9 mg/dL (ref 8.6–10.2)
Chloride: 101 mmol/L (ref 98–110)
Creat: 0.76 mg/dL (ref 0.50–0.96)
Globulin: 2.5 g/dL (ref 1.9–3.7)
Glucose, Bld: 72 mg/dL (ref 65–99)
Potassium: 4.6 mmol/L (ref 3.5–5.3)
Sodium: 138 mmol/L (ref 135–146)
Total Bilirubin: 0.4 mg/dL (ref 0.2–1.2)
Total Protein: 6.8 g/dL (ref 6.1–8.1)
eGFR: 109 mL/min/1.73m2 (ref >=60)

## 2024-10-18 LAB — CBC WITH DIFFERENTIAL/PLATELET
Absolute Lymphocytes: 2714 {cells}/uL (ref 850–3900)
Absolute Monocytes: 681 {cells}/uL (ref 200–950)
Basophils Absolute: 18 {cells}/uL (ref 0–200)
Basophils Relative: 0.2 %
Eosinophils Absolute: 0 {cells}/uL — ABNORMAL LOW (ref 15–500)
Eosinophils Relative: 0 %
HCT: 41.1 % (ref 35.0–45.0)
Hemoglobin: 13.6 g/dL (ref 11.7–15.5)
MCH: 28.6 pg (ref 27.0–33.0)
MCHC: 33.1 g/dL (ref 32.0–36.0)
MCV: 86.3 fL (ref 80.0–100.0)
MPV: 9.1 fL (ref 7.5–12.5)
Monocytes Relative: 7.4 %
Neutro Abs: 5787 {cells}/uL (ref 1500–7800)
Neutrophils Relative %: 62.9 %
Platelets: 373 Thousand/uL (ref 140–400)
RBC: 4.76 Million/uL (ref 3.80–5.10)
RDW: 12.6 % (ref 11.0–15.0)
Total Lymphocyte: 29.5 %
WBC: 9.2 Thousand/uL (ref 3.8–10.8)

## 2024-10-18 LAB — URIC ACID: Uric Acid, Serum: 4.6 mg/dL (ref 2.5–7.0)

## 2024-10-18 LAB — LIPID PANEL
Cholesterol: 195 mg/dL (ref <200)
HDL: 60 mg/dL (ref >=50)
LDL Cholesterol (Calc): 115 mg/dL — ABNORMAL HIGH
Non-HDL Cholesterol (Calc): 135 mg/dL — ABNORMAL HIGH (ref <130)
Total CHOL/HDL Ratio: 3.3 (calc) (ref <5.0)
Triglycerides: 94 mg/dL (ref <150)

## 2024-10-18 MED ORDER — ZORYVE 0.3 % EX FOAM: 1.0000 | Freq: Every day | CUTANEOUS | 1 refills | Status: AC

## 2024-10-18 NOTE — Assessment & Plan Note (Signed)
 Will obtain x-ray. Recommended splinting, NSAIDs, ice. May need referral to hand surgeon

## 2024-10-18 NOTE — Assessment & Plan Note (Signed)

## 2024-10-18 NOTE — Assessment & Plan Note (Signed)
 Well controlled on Effexor  75mg  daily. Denies SI/HI. She is established with psychiatry.    10/18/2024   12:16 PM 09/15/2024   10:14 AM 07/14/2024    1:13 PM 01/14/2024    1:10 PM  GAD 7 : Generalized Anxiety Score  Nervous, Anxious, on Edge 0     Control/stop worrying 0     Worry too much - different things 0     Trouble relaxing 0     Restless 0     Easily annoyed or irritable 2     Afraid - awful might happen 0     Total GAD 7 Score 2     Anxiety Difficulty Not difficult at all        Information is confidential and restricted. Go to Review Flowsheets to unlock data.

## 2024-10-18 NOTE — Assessment & Plan Note (Signed)
 Well controlled on Effexor  75mg  daily. Denies SI/HI. She is established with psychiatry.    10/18/2024   12:15 PM 09/15/2024   10:13 AM 07/14/2024    1:12 PM 01/14/2024    1:09 PM 10/15/2023   11:48 AM  Depression screen PHQ 2/9  Decreased Interest 1      Down, Depressed, Hopeless 0      PHQ - 2 Score 1      Altered sleeping 0      Tired, decreased energy 1      Change in appetite 0      Feeling bad or failure about yourself  0      Trouble concentrating 0      Moving slowly or fidgety/restless 0      Suicidal thoughts 0      PHQ-9 Score 2      Difficult doing work/chores Not difficult at all         Information is confidential and restricted. Go to Review Flowsheets to unlock data.

## 2024-10-18 NOTE — Progress Notes (Signed)
 Subjective:  HPI: [name] is a 28 y.o. female presenting on 10/18/2024 for Acute Visit (Lft index finger pain. Pt c/o 2 month pain in left index finger. Bruised and swelling comes and goes. Pt reports no injuries to finger. /Pt also would like desonate  medication resent to pharmacy due to not receiving.)   HPI Ms Horsman is here for left index finger swelling for two months. Is also overdue for CPE and agreeable to this today.  Discussed the use of AI scribe software for clinical note transcription with the patient, who gave verbal consent to proceed.  History of Present Illness [name] is a 28 year old female who presents with left index finger pain and swelling.  She has been experiencing pain and swelling in her left index finger for approximately two months. The pain is localized on the top of the finger, above the joint, and is described as intermittent, with some days being worse than others. Swelling and bruising fluctuate, with some days showing improvement and others worsening. There is no known injury to the finger, and the pain is exacerbated by even minor contact with objects.  The condition is affecting her work, as she has difficulty grasping objects and cannot fully bend the finger. 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 She has not been using the finger much due to the pain.  She has not taken any specific medication for the finger pain, but she was taking Tylenol  and ibuprofen recently for a respiratory illness. She has not noticed any similar symptoms in other joints and denies excessive alcohol or soda consumption, as well as eating pork or shellfish.  Her past medical history includes psoriasis, for which she has been prescribed a topical treatment. She is currently using Effexor , having recently reduced the dose to 75 mg, and is under the care of a psychiatrist for medication management. She does not smoke or drink  excessively.    Health Maintenance  Topic Date Due   COVID-19 Vaccine (1 - 2025-26 season) 11/03/2024 (Originally 08/28/2024)   Influenza Vaccine  03/27/2025 (Originally 07/28/2024)   Hepatitis B Vaccines 19-59 Average Risk (1 of 3 - 19+ 3-dose series) 10/18/2025 (Originally 08/05/2015)   HPV VACCINES (1 - 3-dose SCDM series) 10/18/2025 (Originally 08/05/2023)   Cervical Cancer Screening (Pap smear)  08/18/2026   Hepatitis C Screening  Completed   HIV Screening  Completed   Pneumococcal Vaccine  Aged Out   Meningococcal B Vaccine  Aged Out   DTaP/Tdap/Td  Discontinued      Review of Systems  Constitutional: Negative.   HENT: Negative.    Eyes: Negative.   Respiratory: Negative.    Cardiovascular: Negative.   Gastrointestinal: Negative.   Genitourinary: Negative.   Musculoskeletal:  Positive for joint pain.  Skin: Negative.   Neurological: Negative.   Endo/Heme/Allergies: Negative.   Psychiatric/Behavioral: Negative.    All other systems reviewed and are negative.   Relevant past medical history reviewed and updated as indicated.   Past Medical History:  Diagnosis Date   Anxiety    Arthritis    Depression    Medical history non-contributory      Past Surgical History:  Procedure Laterality Date   TYMPANOSTOMY TUBE PLACEMENT      Allergies and medications reviewed and updated.   Current Outpatient Medications:    Roflumilast (ZORYVE) 0.3 % FOAM, Apply 1 Application topically daily., Disp: 60 g, Rfl: 1   venlafaxine  XR (EFFEXOR -XR) 75 MG  24 hr capsule, Take 1 capsule (75 mg total) by mouth daily., Disp: 30 capsule, Rfl: 2   albuterol  (VENTOLIN  HFA) 108 (90 Base) MCG/ACT inhaler, Inhale 2 puffs into the lungs every 6 (six) hours as needed for wheezing or shortness of breath (Cough). (Patient not taking: Reported on 10/18/2024), Disp: 18 g, Rfl: 2   desonide  (DESONATE ) 0.05 % gel, Apply topically 2 (two) times daily. (Patient not taking: Reported on 10/18/2024), Disp: 60  g, Rfl: 0   guaifenesin  (HUMIBID E) 400 MG TABS tablet, Take 1 tablet 3 times daily as needed for chest congestion and cough (Patient not taking: Reported on 10/18/2024), Disp: 30 tablet, Rfl: 0  No Known Allergies  Objective:   BP 120/85   Pulse 90   Temp (!) 97.4 F (36.3 C)   Ht 5' 3 (1.6 m)   Wt 184 lb (83.5 kg)   SpO2 98%   BMI 32.59 kg/m      10/18/2024   12:12 PM 09/12/2024    9:00 AM 09/05/2024    9:52 AM  Vitals with BMI  Height 5' 3    Weight 184 lbs    BMI 32.6    Systolic 120 156 856  Diastolic 85 84 97  Pulse 90 103 90     Physical Exam Vitals and nursing note reviewed.  Constitutional:      Appearance: Normal appearance. She is normal weight.  HENT:     Head: Normocephalic and atraumatic.     Right Ear: Tympanic membrane, ear canal and external ear normal.     Left Ear: Tympanic membrane, ear canal and external ear normal.     Nose: Nose normal.     Mouth/Throat:     Mouth: Mucous membranes are moist.     Pharynx: Oropharynx is clear.  Eyes:     Extraocular Movements: Extraocular movements intact.     Conjunctiva/sclera: Conjunctivae normal.     Pupils: Pupils are equal, round, and reactive to light.  Cardiovascular:     Rate and Rhythm: Normal rate and regular rhythm.     Pulses: Normal pulses.     Heart sounds: Normal heart sounds.  Pulmonary:     Effort: Pulmonary effort is normal.     Breath sounds: Normal breath sounds.  Abdominal:     General: Bowel sounds are normal.     Palpations: Abdomen is soft.  Musculoskeletal:        General: Normal range of motion.     Left hand: Swelling and bony tenderness present.     Cervical back: Normal range of motion and neck supple.     Comments: Bruising, swelling, tenderness to left 2nd finger  Skin:    General: Skin is warm and dry.     Capillary Refill: Capillary refill takes less than 2 seconds.  Neurological:     General: No focal deficit present.     Mental Status: She is alert and oriented  to person, place, and time. Mental status is at baseline.  Psychiatric:        Mood and Affect: Mood normal.        Behavior: Behavior normal.        Thought Content: Thought content normal.        Judgment: Judgment normal.     Assessment & Plan:  Physical exam, annual Assessment & Plan: Today your medical history was reviewed and routine physical exam with labs was performed. Recommend 150 minutes of moderate intensity exercise weekly and  consuming a well-balanced diet. Advised to stop smoking if a smoker, avoid smoking if a non-smoker, limit alcohol consumption to 1 drink per day for women and 2 drinks per day for men, and avoid illicit drug use.  Counseled in mental health awareness and when to seek medical care. Vaccine maintenance discussed. Appropriate health maintenance items reviewed. Return to office in 1 year for annual physical exam.   Orders: -     CBC with Differential/Platelet -     Comprehensive metabolic panel with GFR -     Lipid panel -     Uric acid  Pain involving joint of finger of left hand Assessment & Plan: Will obtain x-ray. Recommended splinting, NSAIDs, ice. May need referral to hand surgeon   Psoriasis Assessment & Plan: Skin lesions on her scalp and right shin. Will start Zoryve foam 0.3% once daily. Follow up PRN   Generalized anxiety disorder Assessment & Plan: Well controlled on Effexor  75mg  daily. Denies SI/HI. She is established with psychiatry.    10/18/2024   12:16 PM 09/15/2024   10:14 AM 07/14/2024    1:13 PM 01/14/2024    1:10 PM  GAD 7 : Generalized Anxiety Score  Nervous, Anxious, on Edge 0     Control/stop worrying 0     Worry too much - different things 0     Trouble relaxing 0     Restless 0     Easily annoyed or irritable 2     Afraid - awful might happen 0     Total GAD 7 Score 2     Anxiety Difficulty Not difficult at all        Information is confidential and restricted. Go to Review Flowsheets to unlock data.        Recurrent major depressive disorder, remission status unspecified Assessment & Plan: Well controlled on Effexor  75mg  daily. Denies SI/HI. She is established with psychiatry.    10/18/2024   12:15 PM 09/15/2024   10:13 AM 07/14/2024    1:12 PM 01/14/2024    1:09 PM 10/15/2023   11:48 AM  Depression screen PHQ 2/9  Decreased Interest 1      Down, Depressed, Hopeless 0      PHQ - 2 Score 1      Altered sleeping 0      Tired, decreased energy 1      Change in appetite 0      Feeling bad or failure about yourself  0      Trouble concentrating 0      Moving slowly or fidgety/restless 0      Suicidal thoughts 0      PHQ-9 Score 2      Difficult doing work/chores Not difficult at all         Information is confidential and restricted. Go to Review Flowsheets to unlock data.      Other orders -     Zoryve; Apply 1 Application topically daily.  Dispense: 60 g; Refill: 1     Follow up plan: Return in about 1 year (around 10/18/2025) for annual physical with labs 1 week prior.  Jeoffrey GORMAN Barrio, FNP

## 2024-10-18 NOTE — Assessment & Plan Note (Signed)
 Skin lesions on her scalp and right shin. Will start Zoryve foam 0.3% once daily. Follow up PRN

## 2024-10-19 ENCOUNTER — Telehealth: Payer: Self-pay | Admitting: Pharmacy Technician

## 2024-10-19 ENCOUNTER — Other Ambulatory Visit (HOSPITAL_COMMUNITY): Payer: Self-pay

## 2024-10-19 NOTE — Telephone Encounter (Signed)
 Pharmacy Patient Advocate Encounter   Received notification from CoverMyMeds that prior authorization for Zoryve 0.3% foam is required/requested.   Insurance verification completed.   The patient is insured through [employer].   Per test claim: PA required; PA submitted to above mentioned insurance via CoverMyMeds Key/confirmation #/EOC [id number] Status is pending

## 2024-10-23 ENCOUNTER — Ambulatory Visit: Payer: Self-pay | Admitting: Family Medicine

## 2024-10-23 NOTE — Telephone Encounter (Signed)
 Pharmacy Patient Advocate Encounter  Received notification from [employer] that Prior Authorization for Zoryve 0.3% foam has been DENIED.  No reason given; No denial letter received via Fax or CMM. It has been requested and will be uploaded to the media tab once received.   PA #/Case ID/Reference #: [id number]

## 2024-10-24 ENCOUNTER — Ambulatory Visit
Admission: RE | Admit: 2024-10-24 | Discharge: 2024-10-24 | Disposition: A | Discharge status: Home / Self Care | Source: Ambulatory Visit | Attending: Family Medicine | Admitting: Family Medicine

## 2024-10-24 DIAGNOSIS — M79642 Pain in left hand: Secondary | ICD-10-CM | POA: Diagnosis not present

## 2024-10-24 DIAGNOSIS — M25542 Pain in joints of left hand: Secondary | ICD-10-CM

## 2024-10-24 NOTE — Addendum Note (Signed)
 Addended by: CORINNA BRISKER R on: 10/24/2024 01:00 PM   Modules accepted: Orders

## 2024-11-06 ENCOUNTER — Other Ambulatory Visit: Payer: Self-pay | Admitting: Family Medicine

## 2024-11-06 DIAGNOSIS — M79646 Pain in unspecified finger(s): Secondary | ICD-10-CM

## 2024-11-28 ENCOUNTER — Ambulatory Visit: Admitting: Orthopedic Surgery

## 2024-11-28 DIAGNOSIS — S63631A Sprain of interphalangeal joint of left index finger, initial encounter: Secondary | ICD-10-CM

## 2024-11-28 DIAGNOSIS — S63639A Sprain of interphalangeal joint of unspecified finger, initial encounter: Secondary | ICD-10-CM

## 2024-11-28 NOTE — Progress Notes (Unsigned)
 [name] - 28 y.o. female MRN [id number]  Date of birth: 1996-04-28  Office Visit Note: Visit Date: 11/28/2024 PCP: Kayla Jeoffrey RAMAN, FNP Referred by: Kayla Jeoffrey RAMAN, FNP  Subjective: No chief complaint on file.  HPI: [name] is a pleasant 28 y.o. female who presents today for ongoing left index finger pain and swelling at the PIP region over the past 3 months.  She denies any significant injury that she can point to.  Denies any history of autoimmune or inflammatory condition.  Has not undergone any significant treatment other than over-the-counter anti-inflammatory occasionally, icing and activity modification.  She is overall healthy and active at baseline  Pertinent ROS were reviewed with the patient and found to be negative unless otherwise specified above in HPI.   Visit Reason: left index finger Duration of symptoms: 3 months Hand dominance: right Occupation: museum/gallery conservator Diabetic: No Smoking: No Heart/Lung History: none Blood Thinners:  none  Prior Testing/EMG: xr Injections (Date): none Treatments: none Prior Surgery: none    Assessment & Plan: Visit Diagnoses:  1. Sprain of proximal interphalangeal (PIP) joint of finger     Plan: Based on examination today, this appears to be consistent with underlying PIP sprain.  She does have some swelling and stiffness at the PIP joint.  X-rays do not show any significant fracture or dislocation.  I did explain to her the underlying nature of PIP sprains as well as the slow timeline for recovery.  I explained that they can take up to 1 year sometimes for full resolution of symptoms.  I did also mention her the possibility of underlying inflammatory condition such as gout or pseudogout, with no prior history or family history, this is lower on the differential.  She is welcome to return to me in the future should symptoms not resolve on their own.  I did show her Coban wrapping of the PIP joint today for joint support to be  utilized as needed.  She expressed full understanding.  Follow-up: No follow-ups on file.   Meds & Orders: No orders of the defined types were placed in this encounter.  No orders of the defined types were placed in this encounter.    Procedures: No procedures performed      Clinical History: No specialty comments available.  She reports that she has never smoked. She has never used smokeless tobacco.  Recent Labs    10/18/24 1238  LABURIC 4.6    Objective:   Vital Signs: There were no vitals taken for this visit.  Physical Exam  Gen: Well-appearing, in no acute distress; non-toxic CV: Regular Rate. Well-perfused. Warm.  Resp: Breathing unlabored on room air; no wheezing. Psych: Fluid speech in conversation; appropriate affect; normal thought process  Ortho Exam Left index finger: - PIP joint with notable swelling compared to the contralateral side, able to tolerate range of motion, slightly restricted with deep flexion and extension, mild tenderness to deep palpation, sensation intact distally, hand remains warm well-perfused, no gross instability of the joint   Imaging: No results found. Prior x-rays reviewed of the left hand, unremarkable  Past Medical/Family/Surgical/Social History: Medications & Allergies reviewed per EMR, new medications updated. Patient Active Problem List   Diagnosis Date Noted   Pain involving joint of finger of left hand 10/18/2024   Cervical cancer screening 08/19/2023   Psoriasis 08/19/2023   Elevated LDL cholesterol level 08/19/2023   Obesity (BMI 30.0-34.9) 08/09/2023   Physical exam, annual 08/09/2023   Generalized  anxiety disorder 03/09/2023   Mild episode of recurrent major depressive disorder 04/17/2021   Cannabis use disorder, severe, dependence (HCC) 04/08/2020   MDD (major depressive disorder), severe (HCC) 04/08/2020   Major depression, recurrent 04/08/2020   Major depressive disorder, recurrent severe without psychotic  features (HCC) 03/25/2015   Past Medical History:  Diagnosis Date   Anxiety    Arthritis    Depression    Medical history non-contributory    Family History  Problem Relation Age of Onset   Anxiety disorder Mother    Cancer Mother    Depression Mother    Drug abuse Mother    Alcohol abuse Father    Arthritis Maternal Grandmother    Cancer Maternal Grandmother    Hearing loss Paternal Grandfather    Stroke Paternal Grandfather    Stroke Paternal Grandmother    Cancer Paternal Aunt    Hearing loss Paternal Aunt    Diabetes Maternal Aunt    Past Surgical History:  Procedure Laterality Date   TYMPANOSTOMY TUBE PLACEMENT     Social History   Occupational History   Not on file  Tobacco Use   Smoking status: Never   Smokeless tobacco: Never  Vaping Use   Vaping status: Never Used  Substance and Sexual Activity   Alcohol use: No   Drug use: No   Sexual activity: Never    Olyn Landstrom Estela) Arlinda, M.D. [facility], Hand Surgery
